# Patient Record
Sex: Female | Born: 1990 | Race: Black or African American | Hispanic: No | Marital: Single | Smoking: Never smoker
Health system: Southern US, Community
[De-identification: ages and names within clinical notes are randomized; demographics above are authoritative.]

## PROBLEM LIST (undated history)

## (undated) HISTORY — PX: SALPINGECTOMY: SHX328

## (undated) HISTORY — PX: OOPHORECTOMY: SHX86

---

## 2020-11-26 ENCOUNTER — Encounter (HOSPITAL_COMMUNITY): Payer: Self-pay

## 2020-11-26 ENCOUNTER — Other Ambulatory Visit: Payer: Self-pay

## 2020-11-26 ENCOUNTER — Emergency Department (HOSPITAL_COMMUNITY)
Admission: EM | Admit: 2020-11-26 | Discharge: 2020-11-27 | Disposition: A | Discharge status: Home / Self Care | Payer: Medicaid Other | Attending: Emergency Medicine | Admitting: Emergency Medicine

## 2020-11-26 DIAGNOSIS — N760 Acute vaginitis: Secondary | ICD-10-CM | POA: Diagnosis not present

## 2020-11-26 DIAGNOSIS — B9689 Other specified bacterial agents as the cause of diseases classified elsewhere: Secondary | ICD-10-CM

## 2020-11-26 DIAGNOSIS — N939 Abnormal uterine and vaginal bleeding, unspecified: Secondary | ICD-10-CM

## 2020-11-26 LAB — PREGNANCY, URINE: Preg Test, Ur: NEGATIVE

## 2020-11-26 NOTE — ED Triage Notes (Signed)
Pt states she has taken multiple pregnancy tests, 3 were positive and 1 was negative. Pt stated she was spotting yesterday, but bleeding more today. Pt states there is just spotting in her pad, but bleeding in the toilet when she urinates. Pt denies pain.

## 2020-11-26 NOTE — ED Provider Notes (Signed)
Emergency Medicine Provider Triage Evaluation Note  Hannah Wade , a 30 y.o. female  was evaluated in triage.  Pt complains of vag bleeding after positive preg tests. One + and one - today.  Review of Systems  Positive: Vaginal bleeding Negative: Abd pain, cramping  Physical Exam  BP (!) 119/51 (BP Location: Left Arm)   Pulse 89   Temp 98.3 F (36.8 C) (Oral)   Resp 16   Ht 5\' 9"  (1.753 m)   Wt 61.7 kg   SpO2 100%   BMI 20.10 kg/m  Gen:   Awake, no distress   Resp:  Normal effort  MSK:   Moves extremities without difficulty  Other:    Medical Decision Making  Medically screening exam initiated at 7:52 PM.  Appropriate orders placed.  Hannah Wade was informed that the remainder of the evaluation will be completed by another provider, this initial triage assessment does not replace that evaluation, and the importance of remaining in the ED until their evaluation is complete.     Thurman Coyer 11/26/20 11/28/20, MD 11/26/20 [phone]

## 2020-11-26 NOTE — ED Provider Notes (Signed)
[facility]-EMERGENCY DEPT Provider Note   CSN: [id number] Arrival date & time: 11/26/20  1910     History Chief Complaint  Patient presents with   Vaginal Bleeding    Hannah Wade is a 30 y.o. female with history of ectopic pregnancy s/p nephrectomy who presents emergency department with a chief complaint of vaginal bleeding.  The patient reports that she developed very light spotting yesterday and vaginal bleeding today.  She has only noticed blood with micturition and has not noticed any blood on her pad.  She reports that she took a Plan B pill on August 18.  She tracks her cycles and that was the day of ovulation.  She reports that she googled her symptoms and found that Plan B can be less effective during ovulation.  Thus, she took several home pregnancy test.  She reports that the first 2 were positive, she then took 2 additional pregnancy test today and one was positive and 1 was negative.  Given her history of ectopic pregnancy, she was concerned that she may be having another ectopic pregnancy, which prompted her to come to the ED.  She reports minimal lower, nonradiating abdominal cramping that began tonight.  No fever, chills, vaginal discharge, dysuria, back pain, flank pain, chest pain, shortness of breath, diarrhea, constipation, nausea, or vomiting.  No known aggravating or alleviating factors.  Reports that she has regular menstrual cycles.  Before this episode of bleeding, LMP was 8/4 and lasted for 6 days.  She reports that she last had intercourse 1 week ago.   The history is provided by the patient and medical records. No language interpreter was used.      History reviewed. No pertinent past medical history.  There are no problems to display for this patient.   History reviewed. No pertinent surgical history.   OB History   No obstetric history on file.     History reviewed. No pertinent family history.     Home Medications Prior to  Admission medications   Medication Sig Start Date End Date Taking? Authorizing Provider  metroNIDAZOLE (FLAGYL) 500 MG tablet Take 1 tablet (500 mg total) by mouth 2 (two) times daily for 7 days. 11/27/20 12/04/20 Yes Fahd Galea A, PA-C    Allergies    Patient has no known allergies.  Review of Systems   Review of Systems  Constitutional:  Negative for activity change, chills and fever.  Respiratory:  Negative for shortness of breath.   Cardiovascular:  Negative for chest pain.  Gastrointestinal:  Positive for abdominal pain. Negative for diarrhea, nausea, rectal pain and vomiting.  Genitourinary:  Positive for menstrual problem and vaginal bleeding. Negative for difficulty urinating, dysuria, flank pain, frequency, hematuria, urgency, vaginal discharge and vaginal pain.  Musculoskeletal:  Negative for back pain, myalgias, neck pain and neck stiffness.  Skin:  Negative for color change, rash and wound.  Allergic/Immunologic: Negative for immunocompromised state.  Neurological:  Negative for dizziness, seizures, syncope, weakness, numbness and headaches.  Psychiatric/Behavioral:  Negative for confusion.    Physical Exam Updated Vital Signs BP 118/80 (BP Location: Left Arm)   Pulse 70   Temp 98.4 F (36.9 C) (Oral)   Resp 16   Ht 5\' 9"  (1.753 m)   Wt 61.7 kg   SpO2 100%   BMI 20.10 kg/m   Physical Exam Vitals and nursing note reviewed. Exam conducted with a chaperone present.  Constitutional:      General: She is not in acute distress.  Appearance: She is not ill-appearing, toxic-appearing or diaphoretic.  HENT:     Head: Normocephalic.  Eyes:     Conjunctiva/sclera: Conjunctivae normal.  Cardiovascular:     Rate and Rhythm: Normal rate and regular rhythm.     Heart sounds: No murmur heard.   No friction rub. No gallop.  Pulmonary:     Effort: Pulmonary effort is normal. No respiratory distress.     Breath sounds: No stridor. No wheezing, rhonchi or rales.  Chest:      Chest wall: No tenderness.  Abdominal:     General: There is no distension.     Palpations: Abdomen is soft. There is no mass.     Tenderness: There is no abdominal tenderness. There is no right CVA tenderness, left CVA tenderness, guarding or rebound.     Hernia: No hernia is present.     Comments: Abdomen soft, nontender, nondistended.  Normoactive bowel sounds.  No tenderness over McBurney's point.  No CVA tenderness bilaterally.  Negative Murphy sign.  Genitourinary:    Comments: Dark red blood noted in the vaginal vault.  No clots.  No products of conception.  Cervix is closed.  No cervical motion tenderness.  No adnexal tenderness on the right.  There was felt to be some scarring, likely from previous oophorectomy on the left.  No adnexal masses. Musculoskeletal:     Cervical back: Neck supple.  Skin:    General: Skin is warm.     Findings: No rash.  Neurological:     Mental Status: She is alert.  Psychiatric:        Behavior: Behavior normal.    ED Results / Procedures / Treatments   Labs (all labs ordered are listed, but only abnormal results are displayed) Labs Reviewed  WET PREP, GENITAL - Abnormal; Notable for the following components:      Result Value   Clue Cells Wet Prep HPF POC PRESENT (*)    All other components within normal limits  PREGNANCY, URINE  GC/CHLAMYDIA PROBE AMP () NOT AT [facility]    EKG None  Radiology No results found.  Procedures Procedures   Medications Ordered in ED Medications - No data to display  ED Course  I have reviewed the triage vital signs and the nursing notes.  Pertinent labs & imaging results that were available during my care of the patient were reviewed by me and considered in my medical decision making (see chart for details).    MDM Rules/Calculators/A&P                           30 year old female with history of ectopic pregnancy s/p nephrectomy who presents emergency department with vaginal bleeding,  onset yesterday.  Patient is concerned that she may be having an ectopic pregnancy as she had multiple at home pregnancy test that were positive last week.  Today, she had 1 test that was positive and 1 that was negative.  She took Plan B on August 18, but was concerned that it was ineffective as it was the day that she was ovulating.  She reports minimal associated abdominal pain.  No constitutional symptoms.  Vital signs are unremarkable.  Abdomen is benign.  Pelvic exam with vaginal bleeding, but no products of conception.  Cervix is closed.  No adnexal tenderness or masses.  Labs have been reviewed and independently interpreted by me.  Pregnancy test is negative.  Wet prep with clue cells.  Will treat with Flagyl for bacterial vaginitis.  GC and Chlamydia are pending, but low suspicion for positive test given patient history.  I suspect that this is most likely patient's normal monthly menstrual cycle given that it is 28 days from onset of her last menstrual cycle.  Doubt ectopic pregnancy, spontaneous abortion, PID, appendicitis, pyelonephritis, UTI, bowel obstruction, cholecystitis, pancreatitis.  Vaginal bleeding has been minimal, and checking other labs, including a CBC would not be helpful at this time.  Patient is well-appearing and in no acute distress.  We will provide the patient with a referral to OB/GYN.  ER return precautions given.  Safer discharge to home with outpatient follow-up as discussed.   Final Clinical Impression(s) / ED Diagnoses Final diagnoses:  Vaginal bleeding  Bacterial vaginitis    Rx / DC Orders ED Discharge Orders          Ordered    metroNIDAZOLE (FLAGYL) 500 MG tablet  2 times daily        11/27/20 0037             Emelly Wurtz A, PA-C 11/27/20 0049    Zadie Rhine, MD 11/27/20 [phone]

## 2020-11-27 LAB — GC/CHLAMYDIA PROBE AMP (~~LOC~~) NOT AT ARMC
Chlamydia: NEGATIVE
Comment: NEGATIVE
Comment: NORMAL
Neisseria Gonorrhea: NEGATIVE

## 2020-11-27 LAB — WET PREP, GENITAL
Sperm: NONE SEEN
Trich, Wet Prep: NONE SEEN
WBC, Wet Prep HPF POC: NONE SEEN
Yeast Wet Prep HPF POC: NONE SEEN

## 2020-11-27 MED ORDER — METRONIDAZOLE 500 MG PO TABS: 500.0000 mg | ORAL_TABLET | Freq: Two times a day (BID) | ORAL | 0 refills | Status: AC

## 2020-11-27 NOTE — Discharge Instructions (Addendum)
Thank you for allowing me to care for you today in the Emergency Department.   Your pregnancy test in the emergency department was negative.  You did have clue cells on your pelvic exam.  You do have a pending gonorrhea and chlamydia test.  You would receive a call from the hospital if these test were positive or you can check your MyChart account.  If you have not created a MyChart account, you can follow the instructions on your discharge paperwork to create 1.  Take 1 tablet of Flagyl 2 times daily for the next 7 days for bacterial vaginitis.  It is very important you do not drink alcohol while you are taking this antibiotic as it can cause severe vomiting.  Most likely bleeding is your monthly menstrual cycle since you typically have regular periods. Today is 28 days since your last menstrual cycle.   Return the emergency department if you develop severe, uncontrollable abdominal pain, persistent vomiting, severe abdominal pain with a fever, temperature of 100.4 F or higher, severe vaginal bleeding accompanied by shortness of breath, if you pass out, or other new, concerning symptoms

## 2020-11-28 ENCOUNTER — Telehealth: Payer: Self-pay | Admitting: *Deleted

## 2020-11-28 NOTE — Telephone Encounter (Signed)
Transition Care Management Follow-up Telephone Call Date of discharge and from where: 11/27/2020 - [facility] How have you been since you were released from the hospital? "Okay" Any questions or concerns? No  Items Reviewed: Did the pt receive and understand the discharge instructions provided? Yes  Medications obtained and verified?  N/A Other? No  Any new allergies since your discharge? No  Dietary orders reviewed? No Do you have support at home? Yes    Functional Questionnaire: (I = Independent and D = Dependent) ADLs: I  Bathing/Dressing- I  Meal Prep- I  Eating- I  Maintaining continence- I  Transferring/Ambulation- I  Managing Meds- I  Follow up appointments reviewed:  PCP Hospital f/u appt confirmed? No   Specialist Hospital f/u appt confirmed? No   Are transportation arrangements needed? No  If their condition worsens, is the pt aware to call PCP or go to the Emergency Dept.? Yes Was the patient provided with contact information for the PCP's office or ED? Yes Was to pt encouraged to call back with questions or concerns? Yes

## 2021-01-28 ENCOUNTER — Emergency Department
Admission: EM | Admit: 2021-01-28 | Discharge: 2021-01-28 | Disposition: A | Discharge status: Home / Self Care | Payer: Medicaid Other | Attending: Emergency Medicine | Admitting: Emergency Medicine

## 2021-01-28 ENCOUNTER — Emergency Department: Payer: Medicaid Other

## 2021-01-28 ENCOUNTER — Encounter: Payer: Self-pay | Admitting: Emergency Medicine

## 2021-01-28 ENCOUNTER — Other Ambulatory Visit: Payer: Self-pay

## 2021-01-28 DIAGNOSIS — R109 Unspecified abdominal pain: Secondary | ICD-10-CM | POA: Diagnosis not present

## 2021-01-28 DIAGNOSIS — O2310 Infections of bladder in pregnancy, unspecified trimester: Secondary | ICD-10-CM | POA: Diagnosis not present

## 2021-01-28 DIAGNOSIS — O26891 Other specified pregnancy related conditions, first trimester: Secondary | ICD-10-CM | POA: Diagnosis not present

## 2021-01-28 DIAGNOSIS — N3000 Acute cystitis without hematuria: Secondary | ICD-10-CM | POA: Diagnosis not present

## 2021-01-28 DIAGNOSIS — O2311 Infections of bladder in pregnancy, first trimester: Secondary | ICD-10-CM | POA: Diagnosis not present

## 2021-01-28 DIAGNOSIS — O99891 Other specified diseases and conditions complicating pregnancy: Secondary | ICD-10-CM | POA: Insufficient documentation

## 2021-01-28 DIAGNOSIS — R1031 Right lower quadrant pain: Secondary | ICD-10-CM

## 2021-01-28 DIAGNOSIS — M545 Low back pain, unspecified: Secondary | ICD-10-CM | POA: Diagnosis not present

## 2021-01-28 DIAGNOSIS — Z3A01 Less than 8 weeks gestation of pregnancy: Secondary | ICD-10-CM | POA: Diagnosis not present

## 2021-01-28 LAB — CBC
HCT: 36.7 % (ref 36.0–46.0)
Hemoglobin: 11.6 g/dL — ABNORMAL LOW (ref 12.0–15.0)
MCH: 24.1 pg — ABNORMAL LOW (ref 26.0–34.0)
MCHC: 31.6 g/dL (ref 30.0–36.0)
MCV: 76.3 fL — ABNORMAL LOW (ref 80.0–100.0)
Platelets: 227 10*3/uL (ref 150–400)
RBC: 4.81 MIL/uL (ref 3.87–5.11)
RDW: 17 % — ABNORMAL HIGH (ref 11.5–15.5)
WBC: 6.8 10*3/uL (ref 4.0–10.5)
nRBC: 0 % (ref 0.0–0.2)

## 2021-01-28 LAB — BASIC METABOLIC PANEL
Anion gap: 7 (ref 5–15)
BUN: 17 mg/dL (ref 6–20)
CO2: 26 mmol/L (ref 22–32)
Calcium: 9.4 mg/dL (ref 8.9–10.3)
Chloride: 103 mmol/L (ref 98–111)
Creatinine, Ser: 0.74 mg/dL (ref 0.44–1.00)
GFR, Estimated: >60 mL/min (ref >60)
Glucose, Bld: 103 mg/dL — ABNORMAL HIGH (ref 70–99)
Potassium: 3.4 mmol/L — ABNORMAL LOW (ref 3.5–5.1)
Sodium: 136 mmol/L (ref 135–145)

## 2021-01-28 LAB — POC URINE PREG, ED: Preg Test, Ur: POSITIVE — AB

## 2021-01-28 LAB — URINALYSIS, COMPLETE (UACMP) WITH MICROSCOPIC
Bacteria, UA: NONE SEEN
Bilirubin Urine: NEGATIVE
Glucose, UA: NEGATIVE mg/dL
Hgb urine dipstick: NEGATIVE
Ketones, ur: 5 mg/dL — AB
Nitrite: NEGATIVE
Protein, ur: 30 mg/dL — AB
Specific Gravity, Urine: 1.031 — ABNORMAL HIGH (ref 1.005–1.030)
pH: 6 (ref 5.0–8.0)

## 2021-01-28 LAB — HCG, QUANTITATIVE, PREGNANCY: hCG, Beta Chain, Quant, S: 1249 m[IU]/mL — ABNORMAL HIGH (ref <5)

## 2021-01-28 MED ORDER — CEPHALEXIN 500 MG PO CAPS
500.0000 mg | ORAL_CAPSULE | Freq: Once | ORAL | Status: AC
  Administered 2021-01-28: 500 mg via ORAL
  Filled 2021-01-28: qty 1

## 2021-01-28 MED ORDER — CEPHALEXIN 500 MG PO CAPS: 500.0000 mg | ORAL_CAPSULE | Freq: Four times a day (QID) | ORAL | 0 refills | Status: AC

## 2021-01-28 NOTE — ED Triage Notes (Signed)
Pt to ED from home c/o lower mid back pain wrapping around left and right side to vaginal area like shooting pain and cramping.  Denies urinary changes, vaginal bleeding/discharge/or odor.  Pt states nausea a couple days ago but not today.

## 2021-01-28 NOTE — ED Provider Notes (Signed)
[facility] EMERGENCY DEPARTMENT Provider Note   CSN: [id number] Arrival date & time: 01/28/21  1919     History Chief Complaint  Patient presents with  . Back Pain    Hannah Wade is a 30 y.o. female presents for evaluation of low back pain.  Patient describes tightness in the lower back radiating to the left and right side symptoms began today.  She denies any fevers, urinary symptoms, vaginal bleeding or discharge.  Last menstrual period December 25, 2020.  She has a history of ectopic pregnancy with oophorectomy.  Patient noted to have positive urine pregnancy test.  She denies any numbness tingling radicular symptoms.  No trauma or injury.  Patient did have some nausea couple days ago but no nausea today.  HPI     History reviewed. No pertinent past medical history.  There are no problems to display for this patient.   Past Surgical History:  Procedure Laterality Date  . OOPHORECTOMY     Partial, unsure which side     OB History     Gravida  1   Para      Term      Preterm      AB      Living         SAB      IAB      Ectopic      Multiple      Live Births              History reviewed. No pertinent family history.  Social History   Tobacco Use  . Smoking status: Never  . Smokeless tobacco: Never  Substance Use Topics  . Alcohol use: Never    Comment: socially  . Drug use: Never    Home Medications Prior to Admission medications   Not on File    Allergies    Patient has no known allergies.  Review of Systems   Review of Systems  Constitutional:  Negative for activity change, chills, fatigue and fever.  HENT:  Negative for congestion, sinus pressure and sore throat.   Eyes:  Negative for visual disturbance.  Respiratory:  Negative for cough, chest tightness and shortness of breath.   Cardiovascular:  Negative for chest pain and leg swelling.  Gastrointestinal:  Negative for abdominal pain, diarrhea,  nausea and vomiting.  Genitourinary:  Negative for dysuria, pelvic pain and vaginal discharge.  Musculoskeletal:  Positive for back pain. Negative for arthralgias and gait problem.  Skin:  Negative for rash.  Neurological:  Negative for weakness, numbness and headaches.  Hematological:  Negative for adenopathy.  Psychiatric/Behavioral:  Negative for agitation, behavioral problems and confusion.    Physical Exam Updated Vital Signs BP 120/87 (BP Location: Left Arm)   Pulse 87   Temp 98.2 F (36.8 C) (Oral)   Resp 17   Ht 5\' 9"  (1.753 m)   Wt 58.1 kg   LMP 12/25/2020 (Approximate)   SpO2 100%   BMI 18.90 kg/m   Physical Exam Constitutional:      Appearance: She is well-developed.  HENT:     Head: Normocephalic and atraumatic.  Eyes:     Conjunctiva/sclera: Conjunctivae normal.  Cardiovascular:     Rate and Rhythm: Normal rate.  Pulmonary:     Effort: Pulmonary effort is normal. No respiratory distress.  Abdominal:     General: There is no distension.     Tenderness: There is no abdominal tenderness. There is no guarding.  Musculoskeletal:  General: Normal range of motion.     Cervical back: Normal range of motion.     Comments: Mild lower lumbar paravertebral muscle tenderness with no spinous process tenderness.  Skin:    General: Skin is warm.     Findings: No rash.  Neurological:     General: No focal deficit present.     Mental Status: She is alert and oriented to person, place, and time.     Cranial Nerves: No cranial nerve deficit.  Psychiatric:        Behavior: Behavior normal.        Thought Content: Thought content normal.    ED Results / Procedures / Treatments   Labs (all labs ordered are listed, but only abnormal results are displayed) Labs Reviewed  URINALYSIS, COMPLETE (UACMP) WITH MICROSCOPIC - Abnormal; Notable for the following components:      Result Value   Color, Urine AMBER (*)    APPearance CLOUDY (*)    Specific Gravity, Urine  1.031 (*)    Ketones, ur 5 (*)    Protein, ur 30 (*)    Leukocytes,Ua TRACE (*)    All other components within normal limits  HCG, QUANTITATIVE, PREGNANCY - Abnormal; Notable for the following components:   hCG, Beta Chain, Quant, S 1,249 (*)    All other components within normal limits  CBC - Abnormal; Notable for the following components:   Hemoglobin 11.6 (*)    MCV 76.3 (*)    MCH 24.1 (*)    RDW 17.0 (*)    All other components within normal limits  BASIC METABOLIC PANEL - Abnormal; Notable for the following components:   Potassium 3.4 (*)    Glucose, Bld 103 (*)    All other components within normal limits  POC URINE PREG, ED - Abnormal; Notable for the following components:   Preg Test, Ur POSITIVE (*)    All other components within normal limits    EKG None  Radiology US OB LESS THAN 14 WEEKS WITH OB TRANSVAGINAL  Result Date: 01/28/2021 CLINICAL DATA:  Low back pain with cramping EXAM: OBSTETRIC <14 WK [place] AND TRANSVAGINAL OB US TECHNIQUE: Both transabdominal and transvaginal ultrasound examinations were performed for complete evaluation of the gestation as well as the maternal uterus, adnexal regions, and pelvic cul-de-sac. Transvaginal technique was performed to assess early pregnancy. COMPARISON:  None. FINDINGS: Intrauterine gestational sac: Single Yolk sac:  Not Visualized. Embryo:  Not Visualized. Cardiac Activity: Not Visualized. Heart Rate:   bpm MSD: 2.5 mm   5 w   0 d CRL:    mm    w    d                  [place] EDC: Subchorionic hemorrhage:  None visualized. Maternal uterus/adnexae: No adnexal mass. Small amount of free fluid in the pelvis. IMPRESSION: Probable early intrauterine gestational sac, but no yolk sac, fetal pole, or cardiac activity yet visualized. Recommend follow-up quantitative B-HCG levels and follow-up US in 14 days to assess viability. This recommendation follows SRU consensus guidelines: Diagnostic Criteria for Nonviable Pregnancy Early in the First  Trimester. [place] Med [id number]. Electronically Signed   By: Rolm Baptise M.D.   On: 01/28/2021 23:31    Procedures Procedures   Medications Ordered in ED Medications  cephALEXin (KEFLEX) capsule 500 mg (has no administration in time range)    ED Course  I have reviewed the triage vital signs and the nursing notes.  Pertinent labs & imaging results that were available during my care of the patient were reviewed by me and considered in my medical decision making (see chart for details).    MDM Rules/Calculators/A&P                         30 year old female with lower back pain radiating into the pelvic area.  Pain mild.  Vital signs stable, afebrile with no nausea or vomiting.  No tenderness along the abdominal or pelvic area on exam.  Patient noted to have positive urine pregnancy test.  She has a history of ectopic pregnancy.  Ultrasound obtained showing probable early intrauterine gestational sac.  Too early to visualize fetal pole/cardiac activity.  Recommendation was for follow-up quantitative hCG and ultrasound in 14 days.  Patient will follow-up with OB.  She understands signs and symptoms return to the ER for.  She was noted to have signs of UTI.  Placed on cephalexin. Final Clinical Impression(s) / ED Diagnoses Final diagnoses:  Lower back pain  Bilateral lower abdominal cramping    Rx / DC Orders ED Discharge Orders     None        Renata Caprice 01/29/21 Laurell Roof, MD 01/29/21 0147

## 2021-01-28 NOTE — Discharge Instructions (Addendum)
Please take antibiotics as prescribed.  Make sure you are drinking lots of fluids.  You may take Tylenol only for pain.  Please start taking a daily prenatal vitamin.  Call obstetrician's office tomorrow morning to schedule follow-up appointment in 2 weeks for recheck return to the ER for any increasing pain worsening symptoms or urgent changes in her health

## 2021-02-10 ENCOUNTER — Other Ambulatory Visit: Payer: Self-pay

## 2021-02-10 DIAGNOSIS — N9489 Other specified conditions associated with female genital organs and menstrual cycle: Secondary | ICD-10-CM | POA: Insufficient documentation

## 2021-02-10 DIAGNOSIS — O26891 Other specified pregnancy related conditions, first trimester: Secondary | ICD-10-CM | POA: Diagnosis not present

## 2021-02-10 DIAGNOSIS — O209 Hemorrhage in early pregnancy, unspecified: Secondary | ICD-10-CM | POA: Diagnosis not present

## 2021-02-10 DIAGNOSIS — Z3A01 Less than 8 weeks gestation of pregnancy: Secondary | ICD-10-CM | POA: Insufficient documentation

## 2021-02-10 DIAGNOSIS — R102 Pelvic and perineal pain: Secondary | ICD-10-CM | POA: Diagnosis not present

## 2021-02-10 DIAGNOSIS — O99011 Anemia complicating pregnancy, first trimester: Secondary | ICD-10-CM | POA: Diagnosis not present

## 2021-02-10 DIAGNOSIS — N8311 Corpus luteum cyst of right ovary: Secondary | ICD-10-CM | POA: Diagnosis not present

## 2021-02-10 LAB — CBC WITH DIFFERENTIAL/PLATELET
Abs Immature Granulocytes: 0.01 10*3/uL (ref 0.00–0.07)
Basophils Absolute: 0.1 10*3/uL (ref 0.0–0.1)
Basophils Relative: 1 %
Eosinophils Absolute: 0.4 10*3/uL (ref 0.0–0.5)
Eosinophils Relative: 5 %
HCT: 29 % — ABNORMAL LOW (ref 36.0–46.0)
Hemoglobin: 9.2 g/dL — ABNORMAL LOW (ref 12.0–15.0)
Immature Granulocytes: 0 %
Lymphocytes Relative: 36 %
Lymphs Abs: 3.1 10*3/uL (ref 0.7–4.0)
MCH: 24.7 pg — ABNORMAL LOW (ref 26.0–34.0)
MCHC: 31.7 g/dL (ref 30.0–36.0)
MCV: 78 fL — ABNORMAL LOW (ref 80.0–100.0)
Monocytes Absolute: 0.5 10*3/uL (ref 0.1–1.0)
Monocytes Relative: 6 %
Neutro Abs: 4.6 10*3/uL (ref 1.7–7.7)
Neutrophils Relative %: 52 %
Platelets: 248 10*3/uL (ref 150–400)
RBC: 3.72 MIL/uL — ABNORMAL LOW (ref 3.87–5.11)
RDW: 18.1 % — ABNORMAL HIGH (ref 11.5–15.5)
WBC: 8.7 10*3/uL (ref 4.0–10.5)
nRBC: 0 % (ref 0.0–0.2)

## 2021-02-10 LAB — COMPREHENSIVE METABOLIC PANEL
ALT: 15 U/L (ref 0–44)
AST: 16 U/L (ref 15–41)
Albumin: 4.2 g/dL (ref 3.5–5.0)
Alkaline Phosphatase: 50 U/L (ref 38–126)
Anion gap: 5 (ref 5–15)
BUN: 14 mg/dL (ref 6–20)
CO2: 25 mmol/L (ref 22–32)
Calcium: 8.9 mg/dL (ref 8.9–10.3)
Chloride: 105 mmol/L (ref 98–111)
Creatinine, Ser: 0.61 mg/dL (ref 0.44–1.00)
GFR, Estimated: >60 mL/min (ref >60)
Glucose, Bld: 96 mg/dL (ref 70–99)
Potassium: 3.7 mmol/L (ref 3.5–5.1)
Sodium: 135 mmol/L (ref 135–145)
Total Bilirubin: 0.4 mg/dL (ref 0.3–1.2)
Total Protein: 7.1 g/dL (ref 6.5–8.1)

## 2021-02-10 LAB — ABO/RH: ABO/RH(D): O POS

## 2021-02-10 LAB — HCG, QUANTITATIVE, PREGNANCY: hCG, Beta Chain, Quant, S: 3329 m[IU]/mL — ABNORMAL HIGH (ref <5)

## 2021-02-10 NOTE — ED Triage Notes (Signed)
Pt states that she has had some abd cramping on the left side that started earlier today, pt is pregnant, has not been seen pt obgyn yet, last period 12/25/20. Pt states that she has had some pink discharge as well.

## 2021-02-11 ENCOUNTER — Emergency Department: Payer: Medicaid Other

## 2021-02-11 ENCOUNTER — Emergency Department
Admission: EM | Admit: 2021-02-11 | Discharge: 2021-02-11 | Disposition: A | Discharge status: Home / Self Care | Payer: Medicaid Other | Attending: Emergency Medicine | Admitting: Emergency Medicine

## 2021-02-11 DIAGNOSIS — O26891 Other specified pregnancy related conditions, first trimester: Secondary | ICD-10-CM | POA: Diagnosis not present

## 2021-02-11 DIAGNOSIS — O4690 Antepartum hemorrhage, unspecified, unspecified trimester: Secondary | ICD-10-CM

## 2021-02-11 DIAGNOSIS — Z3A01 Less than 8 weeks gestation of pregnancy: Secondary | ICD-10-CM | POA: Diagnosis not present

## 2021-02-11 DIAGNOSIS — N8311 Corpus luteum cyst of right ovary: Secondary | ICD-10-CM | POA: Diagnosis not present

## 2021-02-11 DIAGNOSIS — D6489 Other specified anemias: Secondary | ICD-10-CM

## 2021-02-11 DIAGNOSIS — R102 Pelvic and perineal pain: Secondary | ICD-10-CM

## 2021-02-11 LAB — URINALYSIS, ROUTINE W REFLEX MICROSCOPIC
Bilirubin Urine: NEGATIVE
Glucose, UA: NEGATIVE mg/dL
Hgb urine dipstick: NEGATIVE
Ketones, ur: NEGATIVE mg/dL
Leukocytes,Ua: NEGATIVE
Nitrite: NEGATIVE
Protein, ur: NEGATIVE mg/dL
Specific Gravity, Urine: 1.027 (ref 1.005–1.030)
pH: 5 (ref 5.0–8.0)

## 2021-02-11 MED ORDER — FERROUS SULFATE 325 (65 FE) MG PO TABS: 325.0000 mg | ORAL_TABLET | Freq: Every day | ORAL | 3 refills | Status: AC

## 2021-02-11 NOTE — ED Notes (Signed)
Pt in US

## 2021-02-11 NOTE — ED Provider Notes (Addendum)
[facility] Emergency Department Provider Note  ____________________________________________   Event Date/Time   First MD Initiated Contact with Patient 02/11/21 [phone]     (approximate)  I have reviewed the triage vital signs and the nursing notes.   HISTORY  Chief Complaint Abdominal Pain and Vaginal Bleeding    HPI Hannah Wade is a 30 y.o. female [id number] who presents to the emergency department with complaints of minimal cramping in the left lower quadrant and seeing a small amount of blood on the toilet paper when she wiped today.  Patient is currently pregnant and her last menstrual period was September 29.  She has had a previous ectopic pregnancy and has required surgery for this but cannot remember what side her ectopic was located.  She has an appoint with her OB/GYN in [place] on Tuesday the 22nd which is her first appointment.  She denies any fevers, nausea or vomiting, dysuria or hematuria, vaginal discharge.  States her bleeding is currently resolved.  She was seen here in the emergency department on 01/28/2021 and at that time ultrasound showed a probable early intrauterine gestational sac but no yolk sac, fetal pole or cardiac activity visualized.  Her hCG at that time was 1249.        History reviewed. No pertinent past medical history.  There are no problems to display for this patient.   Past Surgical History:  Procedure Laterality Date   OOPHORECTOMY     Partial, unsure which side    Prior to Admission medications   Medication Sig Start Date End Date Taking? Authorizing Provider  ferrous sulfate 325 (65 FE) MG tablet Take 1 tablet (325 mg total) by mouth daily. 02/11/21 02/11/22 Yes Karaline Buresh, Layla MawKristen N, DO    Allergies Patient has no known allergies.  No family history on file.  Social History Social History   Tobacco Use   Smoking status: Never   Smokeless tobacco: Never  Substance Use Topics   Alcohol use: Never     Comment: socially   Drug use: Never    Review of Systems Constitutional: No fever. Eyes: No visual changes. ENT: No sore throat. Cardiovascular: Denies chest pain. Respiratory: Denies shortness of breath. Gastrointestinal: No nausea, vomiting, diarrhea. Genitourinary: Negative for dysuria. Musculoskeletal: Negative for back pain. Skin: Negative for rash. Neurological: Negative for focal weakness or numbness.  ____________________________________________   PHYSICAL EXAM:  VITAL SIGNS: ED Triage Vitals  Enc Vitals Group     BP 02/10/21 2231 117/75     Pulse Rate 02/10/21 2231 86     Resp 02/10/21 2231 16     Temp 02/10/21 2231 98.5 F (36.9 C)     Temp Source 02/10/21 2231 Oral     SpO2 02/10/21 2231 96 %     Weight 02/10/21 2231 129 lb (58.5 kg)     Height 02/10/21 2231 5\' 9"  (1.753 m)     Head Circumference --      Peak Flow --      Pain Score 02/10/21 2237 5     Pain Loc --      Pain Edu? --      Excl. in GC? --    CONSTITUTIONAL: Alert and oriented and responds appropriately to questions. Well-appearing; well-nourished HEAD: Normocephalic EYES: Conjunctivae clear, pupils appear equal, EOM appear intact ENT: normal nose; moist mucous membranes NECK: Supple, normal ROM CARD: RRR; S1 and S2 appreciated; no murmurs, no clicks, no rubs, no gallops RESP: Normal chest excursion without splinting or  tachypnea; breath sounds clear and equal bilaterally; no wheezes, no rhonchi, no rales, no hypoxia or respiratory distress, speaking full sentences ABD/GI: Normal bowel sounds; non-distended; soft, non-tender, no rebound, no guarding, no peritoneal signs, no hepatosplenomegaly BACK: The back appears normal EXT: Normal ROM in all joints; no deformity noted, no edema; no cyanosis SKIN: Normal color for age and race; warm; no rash on exposed skin NEURO: Moves all extremities equally PSYCH: The patient's mood and manner are  appropriate.  ____________________________________________   LABS (all labs ordered are listed, but only abnormal results are displayed)  Labs Reviewed  HCG, QUANTITATIVE, PREGNANCY - Abnormal; Notable for the following components:      Result Value   hCG, Beta Chain, Quant, S 3,329 (*)    All other components within normal limits  CBC WITH DIFFERENTIAL/PLATELET - Abnormal; Notable for the following components:   RBC 3.72 (*)    Hemoglobin 9.2 (*)    HCT 29.0 (*)    MCV 78.0 (*)    MCH 24.7 (*)    RDW 18.1 (*)    All other components within normal limits  URINALYSIS, ROUTINE W REFLEX MICROSCOPIC - Abnormal; Notable for the following components:   Color, Urine YELLOW (*)    APPearance HAZY (*)    All other components within normal limits  COMPREHENSIVE METABOLIC PANEL  ABO/RH   ____________________________________________  EKG   ____________________________________________  RADIOLOGY I, Kasaundra Fahrney, personally viewed and evaluated these images (plain radiographs) as part of my medical decision making, as well as reviewing the written report by the radiologist.  ED MD interpretation: Ultrasound shows single intrauterine pregnancy with normal fetal cardiac activity.  Official radiology report(s): US OB LESS THAN 14 WEEKS W/ OB TRANSVAGINAL AND DOPPLER  Result Date: 02/11/2021 CLINICAL DATA:  Pelvic pain for 1 day EXAM: OBSTETRIC <14 WK [place] AND TRANSVAGINAL OB US DOPPLER ULTRASOUND OF OVARIES TECHNIQUE: Both transabdominal and transvaginal ultrasound examinations were performed for complete evaluation of the gestation as well as the maternal uterus, adnexal regions, and pelvic cul-de-sac. Transvaginal technique was performed to assess early pregnancy. Color and duplex Doppler ultrasound was utilized to evaluate blood flow to the ovaries. COMPARISON:  01/28/2021 FINDINGS: Intrauterine gestational sac: Present Yolk sac:  Present Embryo:  Present Cardiac Activity: Present Heart  Rate: 125 bpm CRL:   3.8 mm   6 w 0 d                  [place] EDC: 10/07/2021 Subchorionic hemorrhage:  None visualized. Maternal uterus/adnexae: Ovaries are well visualized bilaterally with normal vascular signals. Corpus luteum cyst is noted within the right ovary. Pulsed Doppler evaluation of both ovaries demonstrates normal appearing low-resistance arterial and venous waveforms. IMPRESSION: Single live intrauterine gestation at 6 weeks 0 days. No complicating factors are noted. Normal appearing ovaries bilaterally with normal vascularity. Electronically Signed   By: Inez Catalina M.D.   On: 02/11/2021 01:27    ____________________________________________   PROCEDURES  Procedure(s) performed (including Critical Care):  Procedures   ____________________________________________   INITIAL IMPRESSION / ASSESSMENT AND PLAN / ED COURSE  As part of my medical decision making, I reviewed the following data within the [facility] notes reviewed and incorporated, Labs reviewed , Old chart reviewed, ultrasound reviewed, and Notes from prior ED visits         Patient's labs today are reassuring.  Her beta hCG is rising and is currently 3329.  She is not actively bleeding and her abdominal  exam is benign.  Transvaginal ultrasound with Doppler was obtained from triage and shows a single intrauterine pregnancy with normal fetal cardiac activity.  Both ovaries have normal blood flow.  Low suspicion for cervicitis, PID, TOA.  Doubt appendicitis.  Offered STD screening today which she declines.  Urine shows no sign of infection.  She is O+ and does not need RhoGAM.  She has follow-up with her OB/GYN scheduled.  Discussed bleeding return precautions.  I feel she is safe to be discharged home.  Blood work shows that she is anemic and has a drop in her hemoglobin from about a couple of weeks ago.  She denies any heavy vaginal bleeding.  Denies any bleeding currently.  This may just be due  to pregnancy but we will start her on iron supplementation.  At this time, I do not feel there is any life-threatening condition present. I have reviewed, interpreted and discussed all results (EKG, imaging, lab, urine as appropriate) and exam findings with patient/family. I have reviewed nursing notes and appropriate previous records.  I feel the patient is safe to be discharged home without further emergent workup and can continue workup as an outpatient as needed. Discussed usual and customary return precautions. Patient/family verbalize understanding and are comfortable with this plan.  Outpatient follow-up has been provided as needed. All questions have been answered.  ____________________________________________   FINAL CLINICAL IMPRESSION(S) / ED DIAGNOSES  Final diagnoses:  Pelvic pain  Vaginal bleeding in pregnancy  Anemia due to other cause, not classified     ED Discharge Orders          Ordered    ferrous sulfate 325 (65 FE) MG tablet  Daily        02/11/21 0411            *Please note:  Jasslyn Finkel was evaluated in Emergency Department on 02/11/2021 for the symptoms described in the history of present illness. She was evaluated in the context of the global COVID-19 pandemic, which necessitated consideration that the patient might be at risk for infection with the SARS-CoV-2 virus that causes COVID-19. Institutional protocols and algorithms that pertain to the evaluation of patients at risk for COVID-19 are in a state of rapid change based on information released by regulatory bodies including the CDC and federal and state organizations. These policies and algorithms were followed during the patient's care in the ED.  Some ED evaluations and interventions may be delayed as a result of limited staffing during and the pandemic.*   Note:  This document was prepared using Dragon voice recognition software and may include unintentional dictation errors.    Heela Heishman, Layla Maw,  DO 02/11/21 0344    Eileene Kisling, Layla Maw, DO 02/11/21 2130

## 2021-02-11 NOTE — ED Notes (Signed)
POC urine POSTIVE

## 2021-02-16 ENCOUNTER — Telehealth: Payer: Self-pay | Admitting: Radiology

## 2021-02-16 NOTE — Telephone Encounter (Signed)
Left voicemail explaining that viability scan has been cancelled because she has already had a scan and to call [facility] to schedule New OB appt.

## 2021-02-20 ENCOUNTER — Emergency Department: Payer: Medicaid Other

## 2021-02-20 ENCOUNTER — Other Ambulatory Visit: Payer: Self-pay

## 2021-02-20 DIAGNOSIS — O209 Hemorrhage in early pregnancy, unspecified: Secondary | ICD-10-CM | POA: Diagnosis not present

## 2021-02-20 DIAGNOSIS — O200 Threatened abortion: Secondary | ICD-10-CM | POA: Diagnosis not present

## 2021-02-20 DIAGNOSIS — Z3A01 Less than 8 weeks gestation of pregnancy: Secondary | ICD-10-CM | POA: Diagnosis not present

## 2021-02-20 DIAGNOSIS — N939 Abnormal uterine and vaginal bleeding, unspecified: Secondary | ICD-10-CM | POA: Diagnosis not present

## 2021-02-20 LAB — CBC WITH DIFFERENTIAL/PLATELET
Abs Immature Granulocytes: 0.01 10*3/uL (ref 0.00–0.07)
Basophils Absolute: 0.1 10*3/uL (ref 0.0–0.1)
Basophils Relative: 1 %
Eosinophils Absolute: 0.3 10*3/uL (ref 0.0–0.5)
Eosinophils Relative: 4 %
HCT: 29.9 % — ABNORMAL LOW (ref 36.0–46.0)
Hemoglobin: 9.5 g/dL — ABNORMAL LOW (ref 12.0–15.0)
Immature Granulocytes: 0 %
Lymphocytes Relative: 38 %
Lymphs Abs: 2.9 10*3/uL (ref 0.7–4.0)
MCH: 24.8 pg — ABNORMAL LOW (ref 26.0–34.0)
MCHC: 31.8 g/dL (ref 30.0–36.0)
MCV: 78.1 fL — ABNORMAL LOW (ref 80.0–100.0)
Monocytes Absolute: 0.6 10*3/uL (ref 0.1–1.0)
Monocytes Relative: 8 %
Neutro Abs: 3.8 10*3/uL (ref 1.7–7.7)
Neutrophils Relative %: 49 %
Platelets: 257 10*3/uL (ref 150–400)
RBC: 3.83 MIL/uL — ABNORMAL LOW (ref 3.87–5.11)
RDW: 18.1 % — ABNORMAL HIGH (ref 11.5–15.5)
WBC: 7.6 10*3/uL (ref 4.0–10.5)
nRBC: 0 % (ref 0.0–0.2)

## 2021-02-20 LAB — URINALYSIS, ROUTINE W REFLEX MICROSCOPIC
Bilirubin Urine: NEGATIVE
Glucose, UA: NEGATIVE mg/dL
Ketones, ur: NEGATIVE mg/dL
Nitrite: NEGATIVE
Protein, ur: NEGATIVE mg/dL
RBC / HPF: >50 RBC/hpf — ABNORMAL HIGH (ref 0–5)
Specific Gravity, Urine: >1.03 — ABNORMAL HIGH (ref 1.005–1.030)
pH: 6.5 (ref 5.0–8.0)

## 2021-02-20 LAB — POC URINE PREG, ED: Preg Test, Ur: POSITIVE — AB

## 2021-02-20 LAB — COMPREHENSIVE METABOLIC PANEL
ALT: 13 U/L (ref 0–44)
AST: 14 U/L — ABNORMAL LOW (ref 15–41)
Albumin: 4.1 g/dL (ref 3.5–5.0)
Alkaline Phosphatase: 48 U/L (ref 38–126)
Anion gap: 5 (ref 5–15)
BUN: 17 mg/dL (ref 6–20)
CO2: 24 mmol/L (ref 22–32)
Calcium: 9.1 mg/dL (ref 8.9–10.3)
Chloride: 104 mmol/L (ref 98–111)
Creatinine, Ser: 0.53 mg/dL (ref 0.44–1.00)
GFR, Estimated: >60 mL/min (ref >60)
Glucose, Bld: 91 mg/dL (ref 70–99)
Potassium: 3.8 mmol/L (ref 3.5–5.1)
Sodium: 133 mmol/L — ABNORMAL LOW (ref 135–145)
Total Bilirubin: 0.7 mg/dL (ref 0.3–1.2)
Total Protein: 7.2 g/dL (ref 6.5–8.1)

## 2021-02-20 LAB — HCG, QUANTITATIVE, PREGNANCY: hCG, Beta Chain, Quant, S: 8544 m[IU]/mL — ABNORMAL HIGH (ref <5)

## 2021-02-20 LAB — ABO/RH: ABO/RH(D): O POS

## 2021-02-20 NOTE — ED Triage Notes (Signed)
Pt c/o of ABD cramping and increasing spotting from the last time she was here. tPer pt, spotting has gotten worse in the last 4-5 days. Pt is currently [redacted] weeks pregnant.

## 2021-02-21 ENCOUNTER — Emergency Department
Admission: EM | Admit: 2021-02-21 | Discharge: 2021-02-21 | Disposition: A | Discharge status: Home / Self Care | Payer: Medicaid Other | Attending: Emergency Medicine | Admitting: Emergency Medicine

## 2021-02-21 DIAGNOSIS — O209 Hemorrhage in early pregnancy, unspecified: Secondary | ICD-10-CM

## 2021-02-21 DIAGNOSIS — O200 Threatened abortion: Secondary | ICD-10-CM

## 2021-02-21 NOTE — ED Provider Notes (Signed)
[facility] Emergency Department Provider Note  ____________________________________________  Time seen: Approximately 2:51 AM  I have reviewed the triage vital signs and the nursing notes.   HISTORY  Chief Complaint Vaginal Bleeding ([redacted] weeks pregnant )   HPI Hannah Wade is a 30 y.o. female at [redacted] weeks gestational age presents for evaluation of vaginal bleeding.  Patient reports lower abdominal cramping with increasing spotting which has gotten worse over the last 4 to 5 days.  Patient has been having spotting for the last 10 days.  She denies passing large blood clots or heavy vaginal bleeding, she denies syncope or near syncope, chest pain or shortness of breath.  She has not been able to see her OB yet because her appointment was postponed  PMH Ectopic pregnancy  Past Surgical History:  Procedure Laterality Date   OOPHORECTOMY     Partial, unsure which side    Prior to Admission medications   Medication Sig Start Date End Date Taking? Authorizing Provider  ferrous sulfate 325 (65 FE) MG tablet Take 1 tablet (325 mg total) by mouth daily. 02/11/21 02/11/22  Ward, Layla Maw, DO    Allergies Patient has no known allergies.  No family history on file.  Social History Social History   Tobacco Use   Smoking status: Never   Smokeless tobacco: Never  Substance Use Topics   Alcohol use: Never    Comment: socially   Drug use: Never    Review of Systems  Constitutional: Negative for fever. Eyes: Negative for visual changes. ENT: Negative for sore throat. Neck: No neck pain  Cardiovascular: Negative for chest pain. Respiratory: Negative for shortness of breath. Gastrointestinal: Negative for abdominal pain, vomiting or diarrhea. Genitourinary: Negative for dysuria. + Suprapubic cramping and vaginal bleeding Musculoskeletal: Negative for back pain. Skin: Negative for rash. Neurological: Negative for headaches, weakness or  numbness. Psych: No SI or HI  ____________________________________________   PHYSICAL EXAM:  VITAL SIGNS: ED Triage Vitals  Enc Vitals Group     BP 02/20/21 2014 105/60     Pulse Rate 02/20/21 2014 90     Resp 02/20/21 2014 17     Temp 02/20/21 2014 98.5 F (36.9 C)     Temp Source 02/20/21 2014 Oral     SpO2 02/20/21 2014 100 %     Weight 02/20/21 2128 128 lb (58.1 kg)     Height 02/20/21 2128 5\' 9"  (1.753 m)     Head Circumference --      Peak Flow --      Pain Score 02/20/21 2127 5     Pain Loc --      Pain Edu? --      Excl. in GC? --     Constitutional: Alert and oriented. Well appearing and in no apparent distress. HEENT:      Head: Normocephalic and atraumatic.         Eyes: Conjunctivae are normal. Sclera is non-icteric.       Mouth/Throat: Mucous membranes are moist.       Neck: Supple with no signs of meningismus. Cardiovascular: Regular rate and rhythm. No murmurs, gallops, or rubs. 2+ symmetrical distal pulses are present in all extremities. No JVD. Respiratory: Normal respiratory effort. Lungs are clear to auscultation bilaterally.  Gastrointestinal: Soft, non tender, and non distended with positive bowel sounds. No rebound or guarding. Genitourinary: No CVA tenderness. Musculoskeletal:  No edema, cyanosis, or erythema of extremities. Neurologic: Normal speech and language. Face is symmetric.  Moving all extremities. No gross focal neurologic deficits are appreciated. Skin: Skin is warm, dry and intact. No rash noted. Psychiatric: Mood and affect are normal. Speech and behavior are normal.  ____________________________________________   LABS (all labs ordered are listed, but only abnormal results are displayed)  Labs Reviewed  HCG, QUANTITATIVE, PREGNANCY - Abnormal; Notable for the following components:      Result Value   hCG, Beta Chain, Quant, S 8,544 (*)    All other components within normal limits  CBC WITH DIFFERENTIAL/PLATELET - Abnormal;  Notable for the following components:   RBC 3.83 (*)    Hemoglobin 9.5 (*)    HCT 29.9 (*)    MCV 78.1 (*)    MCH 24.8 (*)    RDW 18.1 (*)    All other components within normal limits  COMPREHENSIVE METABOLIC PANEL - Abnormal; Notable for the following components:   Sodium 133 (*)    AST 14 (*)    All other components within normal limits  URINALYSIS, ROUTINE W REFLEX MICROSCOPIC - Abnormal; Notable for the following components:   Color, Urine YELLOW (*)    APPearance CLOUDY (*)    Specific Gravity, Urine >1.030 (*)    Hgb urine dipstick LARGE (*)    Leukocytes,Ua TRACE (*)    RBC / HPF >50 (*)    Bacteria, UA MANY (*)    All other components within normal limits  POC URINE PREG, ED - Abnormal; Notable for the following components:   Preg Test, Ur Positive (*)    All other components within normal limits  ABO/RH   ____________________________________________  EKG  none  ____________________________________________  RADIOLOGY  I have personally reviewed the images performed during this visit and I agree with the Radiologist's read.   Interpretation by Radiologist:  US OB LESS THAN 14 WEEKS WITH OB TRANSVAGINAL  Result Date: 02/20/2021 CLINICAL DATA:  Vaginal bleeding. Gestational age based on first ultrasound is 7 weeks, 2 days. EXAM: OBSTETRIC <14 WK [place] AND TRANSVAGINAL OB US TECHNIQUE: Both transabdominal and transvaginal ultrasound examinations were performed for complete evaluation of the gestation as well as the maternal uterus, adnexal regions, and pelvic cul-de-sac. Transvaginal technique was performed to assess early pregnancy. COMPARISON:  Ultrasound dated 02/11/2021. FINDINGS: Intrauterine gestational sac: Single intrauterine gestational sac. The gestational sac appears somewhat small for the fetal pole. Close follow-up recommended. Yolk sac:  Seen Embryo:  Present Cardiac Activity: Detected Heart Rate: 158 bpm CRL:  11 mm   7 w   1 d                  [place] EDC:  10/08/2021 Subchorionic hemorrhage:  None visualized. Maternal uterus/adnexae: The uterus is anteverted and appears slightly enlarged. There is heterogeneous appearance of the myometrium with findings suggestive of adenomyosis. The maternal ovaries are unremarkable. IMPRESSION: 1. Single live intrauterine pregnancy with an estimated gestational age of [redacted] weeks, 1 day. 2. 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 Electronically Signed   By: Elgie Collard M.D.   On: 02/20/2021 22:59     ____________________________________________   PROCEDURES  Procedure(s) performed: None Procedures   Critical Care performed:  None ____________________________________________   INITIAL IMPRESSION / ASSESSMENT AND PLAN / ED COURSE   30 y.o. female at [redacted] weeks gestational age presents for evaluation of vaginal bleeding.  Patient with 10 days of spotting and suprapubic cramping.  She is hemodynamically stable with stable hemoglobin at 9.5.  hCG is trending up at 8544.  Transvaginal ultrasound showing a  normal IUP with no significant abnormalities.  Patient's blood type is O+ and no indication for RhoGAM.  We discussed pelvic rest and close follow-up with OB/GYN for repeat ultrasound.  We will also discuss signs and symptoms of acute blood loss anemia recommended return to the emergency room if these develop.  I did review patient's chart including her visit to the ED 10 days ago and the results of the ultrasound then.  Patient was reassured.      _____________________________________________ Please note:  Patient was evaluated in Emergency Department today for the symptoms described in the history of present illness. Patient was evaluated in the context of the global COVID-19 pandemic, which necessitated consideration that the patient might be at risk for infection with the SARS-CoV-2 virus that causes COVID-19. Institutional protocols and algorithms that pertain to the evaluation of patients at risk for COVID-19 are in a  state of rapid change based on information released by regulatory bodies including the CDC and federal and state organizations. These policies and algorithms were followed during the patient's care in the ED.  Some ED evaluations and interventions may be delayed as a result of limited staffing during the pandemic.   [place] Controlled Substance Database was reviewed by me. ____________________________________________   FINAL CLINICAL IMPRESSION(S) / ED DIAGNOSES   Final diagnoses:  Threatened miscarriage      NEW MEDICATIONS STARTED DURING THIS VISIT:  ED Discharge Orders     None        Note:  This document was prepared using Dragon voice recognition software and may include unintentional dictation errors.    Nita Sickle, MD 02/21/21 [phone]

## 2021-02-24 ENCOUNTER — Other Ambulatory Visit: Payer: Self-pay

## 2021-02-24 ENCOUNTER — Other Ambulatory Visit: Payer: Medicaid Other

## 2021-02-24 ENCOUNTER — Other Ambulatory Visit: Payer: Self-pay | Admitting: *Deleted

## 2021-02-24 DIAGNOSIS — O209 Hemorrhage in early pregnancy, unspecified: Secondary | ICD-10-CM

## 2021-02-24 NOTE — Progress Notes (Signed)
Pt was here for HCG, looked over chart, talked with patient. Pt had heavy bleeding over the weekend and is concerned she may have miscarried. Discussed with Dr A and will order a for a formal scan.

## 2021-03-03 ENCOUNTER — Ambulatory Visit
Admission: RE | Admit: 2021-03-03 | Discharge: 2021-03-03 | Disposition: A | Discharge status: Home / Self Care | Payer: Medicaid Other | Source: Ambulatory Visit | Attending: Obstetrics & Gynecology | Admitting: Obstetrics & Gynecology

## 2021-03-03 ENCOUNTER — Other Ambulatory Visit: Payer: Self-pay

## 2021-03-03 DIAGNOSIS — O209 Hemorrhage in early pregnancy, unspecified: Secondary | ICD-10-CM | POA: Insufficient documentation

## 2021-03-03 DIAGNOSIS — Z3A08 8 weeks gestation of pregnancy: Secondary | ICD-10-CM | POA: Diagnosis not present

## 2021-03-04 ENCOUNTER — Telehealth: Payer: Self-pay | Admitting: *Deleted

## 2021-03-04 NOTE — Telephone Encounter (Signed)
Pt informed of [place] results and will keep appt on 12/28 with Dr Shawnie Pons to follow up on SAB.

## 2021-03-04 NOTE — Telephone Encounter (Signed)
-----   Message from Tereso Newcomer, MD sent at 03/04/2021 12:42 PM EST ----- Completed miscarriage. Can discuss future family planning/contraception during appointment with Dr. Shawnie Pons on 03/25/21.  Also needs pap smear. Please call to inform patient of results and recommendations.

## 2021-03-25 ENCOUNTER — Encounter: Payer: Self-pay | Admitting: Family Medicine

## 2021-03-25 ENCOUNTER — Other Ambulatory Visit (HOSPITAL_COMMUNITY)
Admission: RE | Admit: 2021-03-25 | Discharge: 2021-03-25 | Disposition: A | Discharge status: Home / Self Care | Payer: Medicaid Other | Source: Ambulatory Visit | Attending: Family Medicine | Admitting: Family Medicine

## 2021-03-25 ENCOUNTER — Ambulatory Visit (INDEPENDENT_AMBULATORY_CARE_PROVIDER_SITE_OTHER): Payer: Medicaid Other | Admitting: Family Medicine

## 2021-03-25 ENCOUNTER — Other Ambulatory Visit: Payer: Self-pay

## 2021-03-25 VITALS — BP 107/66 | HR 79 | Wt 131.0 lb

## 2021-03-25 DIAGNOSIS — Z113 Encounter for screening for infections with a predominantly sexual mode of transmission: Secondary | ICD-10-CM

## 2021-03-25 DIAGNOSIS — Z124 Encounter for screening for malignant neoplasm of cervix: Secondary | ICD-10-CM | POA: Insufficient documentation

## 2021-03-25 NOTE — Progress Notes (Signed)
° °  Subjective:    Patient ID: Hannah Wade is a 30 y.o. female presenting with No chief complaint on file.  on 03/25/2021  HPI: Has had SAB on 12/7, no further bleeding. Cycles are regular. Does not desire contraception though she also does not desire pregnancy. She would like STD screening.  Review of Systems  Constitutional:  Negative for chills and fever.  Respiratory:  Negative for shortness of breath.   Cardiovascular:  Negative for chest pain.  Gastrointestinal:  Negative for abdominal pain, nausea and vomiting.  Genitourinary:  Negative for dysuria.  Skin:  Negative for rash.     Objective:    LMP 12/25/2020 (Exact Date)  Physical Exam Constitutional:      General: She is not in acute distress.    Appearance: She is well-developed.  HENT:     Head: Normocephalic and atraumatic.  Eyes:     General: No scleral icterus. Cardiovascular:     Rate and Rhythm: Normal rate.  Pulmonary:     Effort: Pulmonary effort is normal.  Abdominal:     Palpations: Abdomen is soft.  Genitourinary:    Comments: BUS normal, vagina is pink and rugated, cervix is parous without lesion, uterus is small and anteverted, no adnexal mass or tenderness.  Musculoskeletal:     Cervical back: Neck supple.  Skin:    General: Skin is warm and dry.  Neurological:     Mental Status: She is alert and oriented to person, place, and time.        Assessment & Plan:  Screening for cervical cancer - Plan: Cytology - PAP  Screen for STD (sexually transmitted disease) - Plan: Hepatitis B surface antigen, Hepatitis C antibody, HIV Antibody (routine testing w rflx), RPR   Return in about 1 year (around 03/25/2022) for a CPE.  Reva Bores 03/25/2021 10:30 AM

## 2021-03-26 LAB — HEPATITIS C ANTIBODY: Hep C Virus Ab: 0.2 s/co ratio (ref 0.0–0.9)

## 2021-03-26 LAB — HEPATITIS B SURFACE ANTIGEN: Hepatitis B Surface Ag: NEGATIVE

## 2021-03-26 LAB — HIV ANTIBODY (ROUTINE TESTING W REFLEX): HIV Screen 4th Generation wRfx: NONREACTIVE

## 2021-03-26 LAB — RPR: RPR Ser Ql: NONREACTIVE

## 2021-03-27 LAB — CYTOLOGY - PAP
Chlamydia: NEGATIVE
Comment: NEGATIVE
Comment: NORMAL
Diagnosis: NEGATIVE
Neisseria Gonorrhea: NEGATIVE

## 2021-08-29 ENCOUNTER — Emergency Department
Admission: EM | Admit: 2021-08-29 | Discharge: 2021-08-29 | Disposition: A | Discharge status: Home / Self Care | Payer: Medicaid Other | Attending: Emergency Medicine | Admitting: Emergency Medicine

## 2021-08-29 ENCOUNTER — Other Ambulatory Visit: Payer: Self-pay

## 2021-08-29 ENCOUNTER — Encounter: Payer: Self-pay | Admitting: Emergency Medicine

## 2021-08-29 DIAGNOSIS — G43809 Other migraine, not intractable, without status migrainosus: Secondary | ICD-10-CM | POA: Insufficient documentation

## 2021-08-29 DIAGNOSIS — R519 Headache, unspecified: Secondary | ICD-10-CM | POA: Diagnosis present

## 2021-08-29 LAB — BASIC METABOLIC PANEL
Anion gap: 10 (ref 5–15)
BUN: 10 mg/dL (ref 6–20)
CO2: 20 mmol/L — ABNORMAL LOW (ref 22–32)
Calcium: 8.9 mg/dL (ref 8.9–10.3)
Chloride: 109 mmol/L (ref 98–111)
Creatinine, Ser: 0.58 mg/dL (ref 0.44–1.00)
GFR, Estimated: >60 mL/min (ref >60)
Glucose, Bld: 96 mg/dL (ref 70–99)
Potassium: 3.6 mmol/L (ref 3.5–5.1)
Sodium: 139 mmol/L (ref 135–145)

## 2021-08-29 LAB — URINALYSIS, ROUTINE W REFLEX MICROSCOPIC
Bilirubin Urine: NEGATIVE
Glucose, UA: NEGATIVE mg/dL
Hgb urine dipstick: NEGATIVE
Ketones, ur: 5 mg/dL — AB
Leukocytes,Ua: NEGATIVE
Nitrite: NEGATIVE
Protein, ur: NEGATIVE mg/dL
Specific Gravity, Urine: 1.021 (ref 1.005–1.030)
pH: 7 (ref 5.0–8.0)

## 2021-08-29 LAB — CBC
HCT: 35.5 % — ABNORMAL LOW (ref 36.0–46.0)
Hemoglobin: 11 g/dL — ABNORMAL LOW (ref 12.0–15.0)
MCH: 23.9 pg — ABNORMAL LOW (ref 26.0–34.0)
MCHC: 31 g/dL (ref 30.0–36.0)
MCV: 77.2 fL — ABNORMAL LOW (ref 80.0–100.0)
Platelets: 255 10*3/uL (ref 150–400)
RBC: 4.6 MIL/uL (ref 3.87–5.11)
RDW: 15.9 % — ABNORMAL HIGH (ref 11.5–15.5)
WBC: 7.1 10*3/uL (ref 4.0–10.5)
nRBC: 0 % (ref 0.0–0.2)

## 2021-08-29 LAB — POC URINE PREG, ED: Preg Test, Ur: NEGATIVE

## 2021-08-29 MED ORDER — ONDANSETRON 4 MG PO TBDP
4.0000 mg | ORAL_TABLET | Freq: Once | ORAL | Status: AC
  Administered 2021-08-29: 4 mg via ORAL
  Filled 2021-08-29: qty 1

## 2021-08-29 MED ORDER — IBUPROFEN 400 MG PO TABS
400.0000 mg | ORAL_TABLET | Freq: Once | ORAL | Status: AC
  Administered 2021-08-29: 400 mg via ORAL
  Filled 2021-08-29: qty 1

## 2021-08-29 MED ORDER — ONDANSETRON HCL 4 MG PO TABS: 4.0000 mg | ORAL_TABLET | Freq: Every day | ORAL | 0 refills | Status: AC | PRN

## 2021-08-29 NOTE — ED Provider Notes (Signed)
[facility] Provider Note    Event Date/Time   First MD Initiated Contact with Patient 08/29/21 1322     (approximate)   History   Headache and Vomiting   HPI  Rashelle Geschke is a 31 y.o. female   with past medical history of migraines and anemia who presents with headache and vomiting.  Patient started having headache yesterday.  Was gradual in onset and not maximal in onset.  It is located on the left side of her head.  She took some Mucinex for it.  Then today she was going to eat when she suddenly felt nauseous and had about 6 episodes of yellow emesis.  Denies diarrhea or abdominal pain.  She just finished her menstrual period no urinary symptoms.  Nausea and vomiting have resolved.  Headache is also essentially gone now.     History reviewed. No pertinent past medical history.  There are no problems to display for this patient.    Physical Exam  Triage Vital Signs: ED Triage Vitals  Enc Vitals Group     BP 08/29/21 1306 120/87     Pulse Rate 08/29/21 1306 92     Resp 08/29/21 1306 18     Temp 08/29/21 1306 97.7 F (36.5 C)     Temp Source 08/29/21 1306 Oral     SpO2 08/29/21 1306 97 %     Weight 08/29/21 1304 137 lb (62.1 kg)     Height 08/29/21 1304 5\' 9"  (1.753 m)     Head Circumference --      Peak Flow --      Pain Score 08/29/21 1304 8     Pain Loc --      Pain Edu? --      Excl. in [place]? --     Most recent vital signs: Vitals:   08/29/21 1306  BP: 120/87  Pulse: 92  Resp: 18  Temp: 97.7 F (36.5 C)  SpO2: 97%     General: Awake, no distress.  CV:  Good peripheral perfusion.  Resp:  Normal effort.  Abd:  No distention.  Soft and nontender throughout Neuro:             Awake, Alert, Oriented x 3  Other:  Aox3, nml speech  PERRL, EOMI, face symmetric, nml tongue movement  5/5 strength in the BL upper and lower extremities  Sensation grossly intact in the BL upper and lower extremities  Finger-nose-finger intact  BL    ED Results / Procedures / Treatments  Labs (all labs ordered are listed, but only abnormal results are displayed) Labs Reviewed  CBC - Abnormal; Notable for the following components:      Result Value   Hemoglobin 11.0 (*)    HCT 35.5 (*)    MCV 77.2 (*)    MCH 23.9 (*)    RDW 15.9 (*)    All other components within normal limits  BASIC METABOLIC PANEL - Abnormal; Notable for the following components:   CO2 20 (*)    All other components within normal limits  URINALYSIS, ROUTINE W REFLEX MICROSCOPIC - Abnormal; Notable for the following components:   Color, Urine YELLOW (*)    APPearance CLOUDY (*)    Ketones, ur 5 (*)    All other components within normal limits  POC URINE PREG, ED     EKG     RADIOLOGY    PROCEDURES:  Critical Care performed: No  Procedures   MEDICATIONS ORDERED  IN ED: Medications  ibuprofen (ADVIL) tablet 400 mg (400 mg Oral Given 08/29/21 1415)  ondansetron (ZOFRAN-ODT) disintegrating tablet 4 mg (4 mg Oral Given 08/29/21 1414)     IMPRESSION / MDM / ASSESSMENT AND PLAN / ED COURSE  I reviewed the triage vital signs and the nursing notes.                              Patient's presentation is most consistent with acute complicated illness / injury requiring diagnostic workup.  Differential diagnosis includes, but is not limited to, migraine headache, pregnancy, gastroenteritis, likely cerebral venous sinus thrombosis, subarachnoid hemorrhage intracranial bleed  Patient is a 31 year old female with past history of migraines who presents with headache and vomiting.  Headache started yesterday and was typical of her migraine headaches.  Is actually essentially resolved now neurologic exam is nonfocal.  Today she had about 6 episodes of vomiting back-to-back and the nausea and vomiting have now resolved.  She did have a headache at the time of the vomiting which have both improved significantly.  Does not usually vomit with her migraine  headaches.  She has no abdominal pain to suggest GI pathology we will send an hCG but patient just finished her menstrual period.  Her neurologic exam again is nonfocal I have low suspicion for serious underlying cause of headache causing both of these including cerebral venous sinus thrombosis subarachnoid intracranial hemorrhage etc.  We will treat supportively.  Patient already improving on her own.  Pregnancy test is negative.  Patient was given Zofran and ibuprofen.  Given her symptoms have improved she is tolerating p.o. she is appropriate for discharge.     FINAL CLINICAL IMPRESSION(S) / ED DIAGNOSES   Final diagnoses:  Other migraine without status migrainosus, not intractable     Rx / DC Orders   ED Discharge Orders          Ordered    ondansetron (ZOFRAN) 4 MG tablet  Daily PRN        08/29/21 1417             Note:  This document was prepared using Dragon voice recognition software and may include unintentional dictation errors.   Rada Hay, MD 08/29/21 [phone]

## 2021-08-29 NOTE — ED Notes (Signed)
Pt reports h/a x2 days. This morning after standing pt became dizzy and started vomiting.

## 2021-08-29 NOTE — ED Notes (Signed)
Urine pregnancy negative. Result has not crossed over into chart yet.

## 2021-08-29 NOTE — Discharge Instructions (Addendum)
Your pregnancy test was negative.  If you have any ongoing nausea you can take the Zofran.  You can take ibuprofen for headache.

## 2021-08-29 NOTE — ED Triage Notes (Signed)
Pt via POV from home. Pt c/o headache and vomiting. Headache started 2 days ago and vomiting started yesterday. Pt has a hx of migraine but states that it has never been this bad. States she has vomited about 6 times since this AM. Pt is A&OX4 and NAD.

## 2021-08-31 ENCOUNTER — Ambulatory Visit: Payer: Medicaid Other | Admitting: Family Medicine

## 2021-09-02 ENCOUNTER — Ambulatory Visit: Payer: Medicaid Other | Admitting: Family Medicine

## 2021-12-01 ENCOUNTER — Ambulatory Visit: Payer: Self-pay | Admitting: Internal Medicine

## 2021-12-10 ENCOUNTER — Telehealth: Payer: Self-pay

## 2021-12-10 NOTE — Telephone Encounter (Signed)
TC to pt regarding upcoming appt.  No answer LVM.

## 2021-12-23 ENCOUNTER — Ambulatory Visit: Payer: Self-pay | Admitting: Family Medicine

## 2021-12-23 ENCOUNTER — Encounter: Payer: Self-pay | Admitting: Family Medicine

## 2021-12-24 NOTE — Progress Notes (Signed)
Patient did not keep appointment today. She may call to reschedule.  

## 2022-07-09 ENCOUNTER — Ambulatory Visit: Payer: Self-pay

## 2022-08-13 IMAGING — US US OB < 14 WEEKS - US OB TV
1 series · 14 of 28 positions shown · non-contrast
Comparison: Ultrasound dated 02/11/2021.

CLINICAL DATA: Vaginal bleeding. Gestational age based on first
ultrasound is 7 weeks, 2 days.

EXAM:
OBSTETRIC <14 WK US AND TRANSVAGINAL OB US
TECHNIQUE: Both transabdominal and transvaginal ultrasound examinations were
performed for complete evaluation of the gestation as well as the
maternal uterus, adnexal regions, and pelvic cul-de-sac.
Transvaginal technique was performed to assess early pregnancy.

[Series 1: us ob less than 14 weeks with ob transvaginal · 90 acquisitions, 14 frames shown]
[im 4/90]
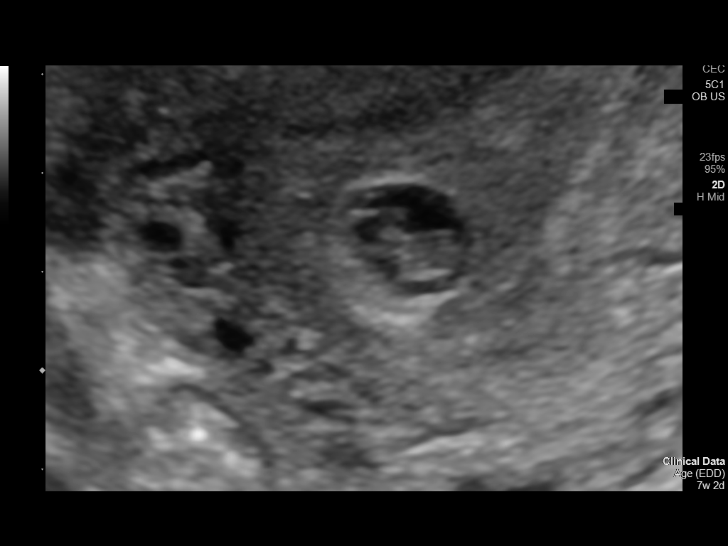
[im 10/90]
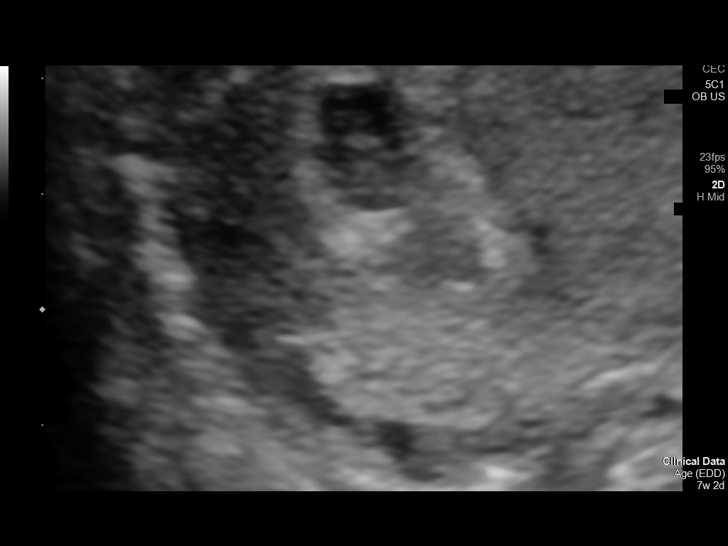
[im 17/90]
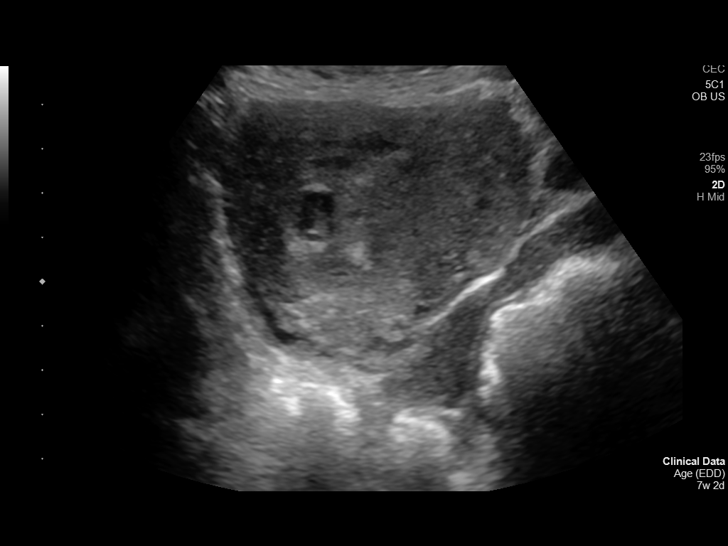
[im 24/90]
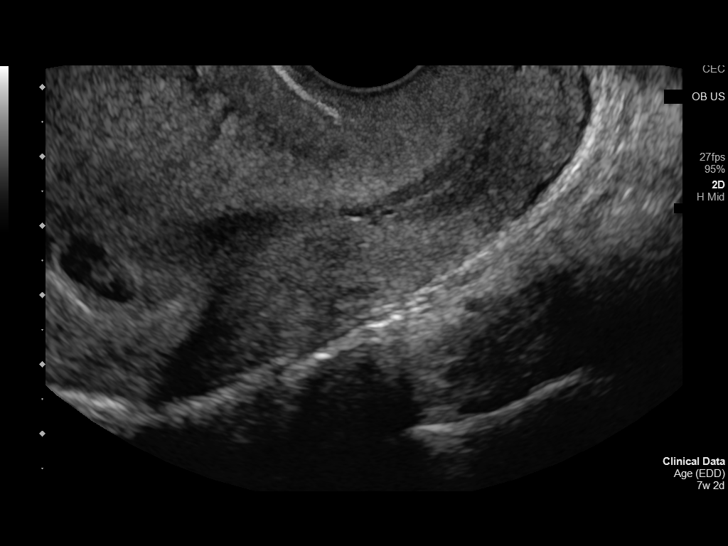
[im 30/90]
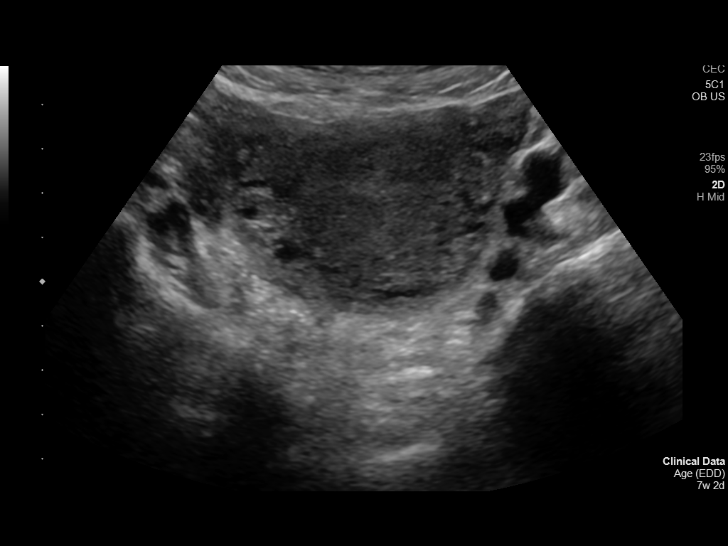
[im 37/90]
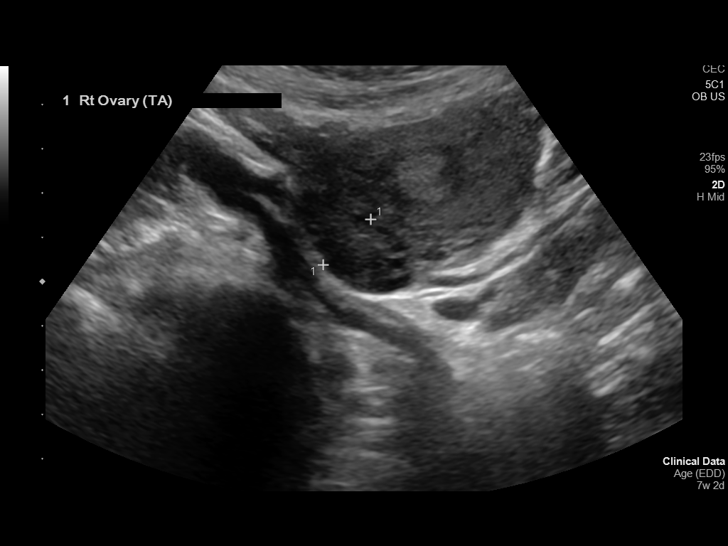
[im 43/90]
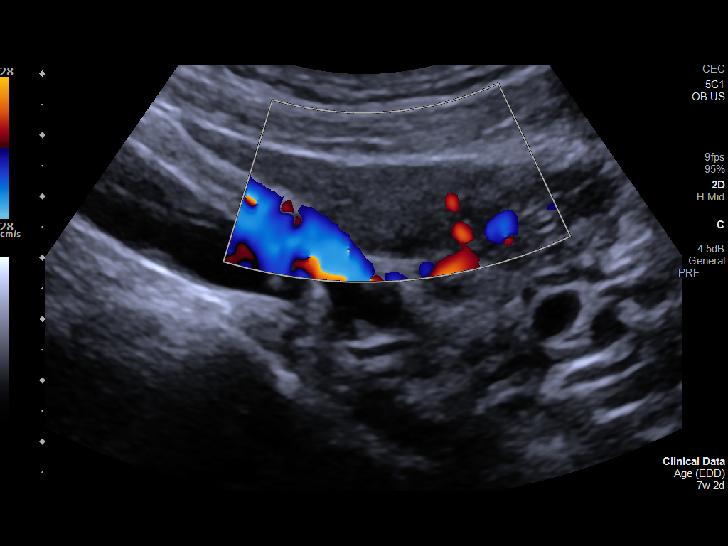
[im 50/90]
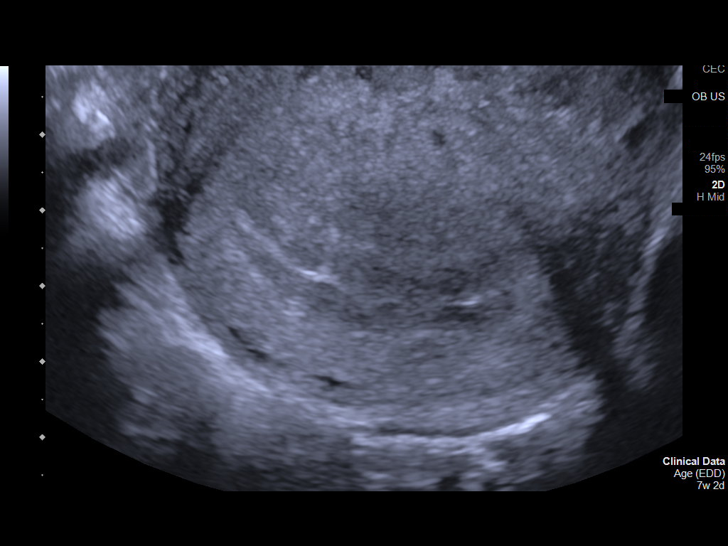
[im 57/90]
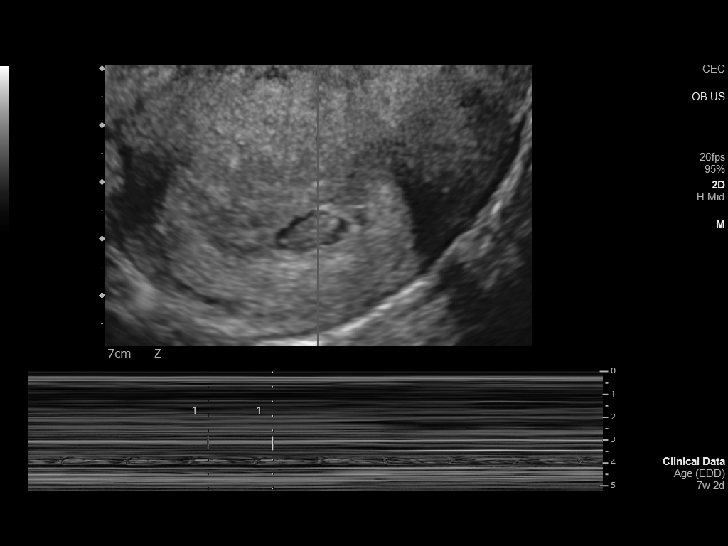
[im 63/90]
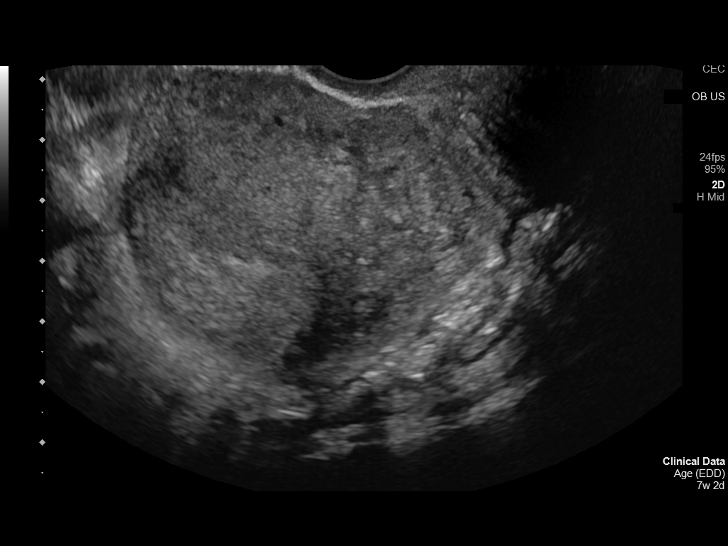
[im 70/90]
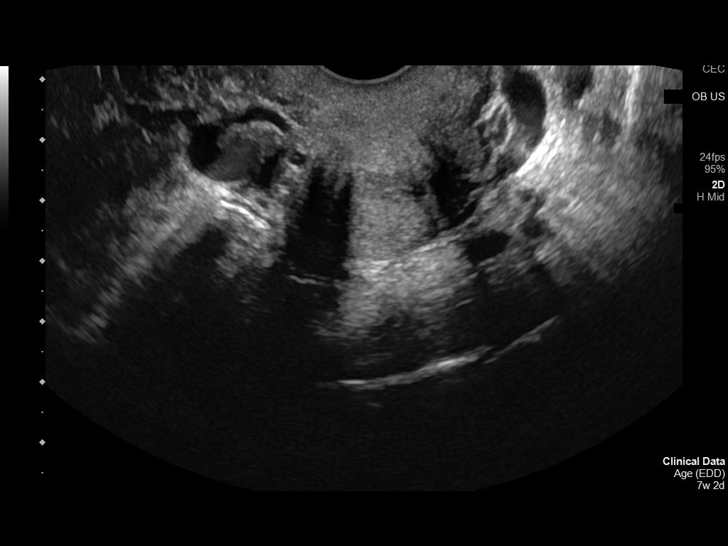
[im 76/90]
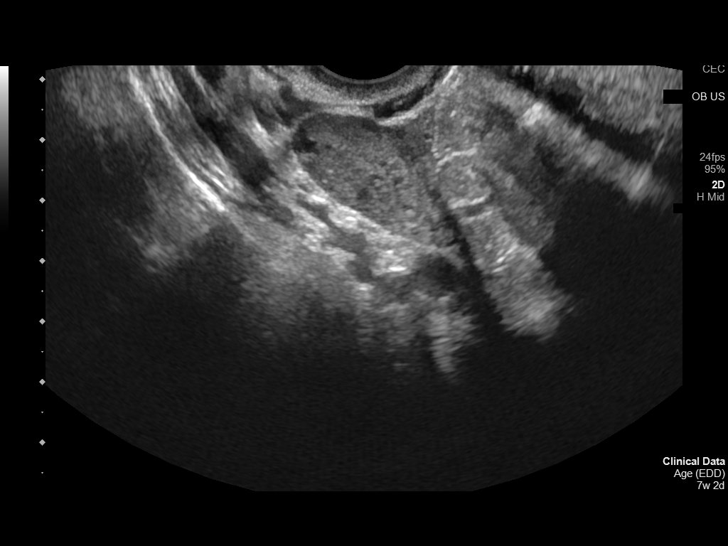
[im 83/90]
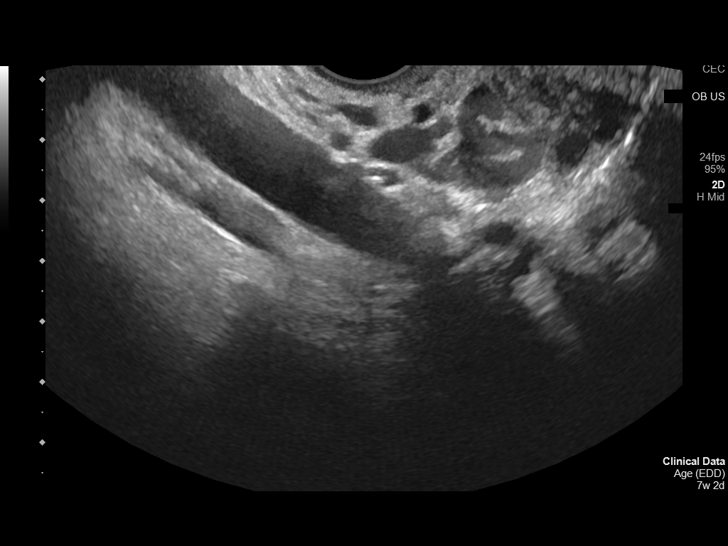
[im 90/90]
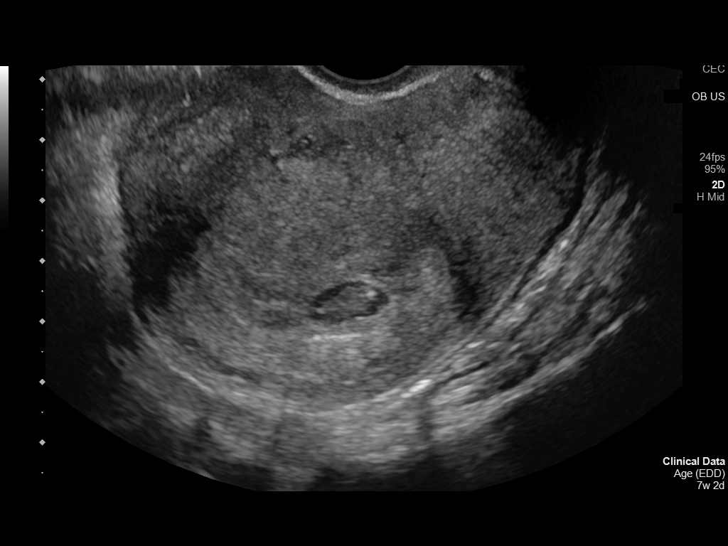

[14 of 28 positions shown; findings below may reference images not displayed]

FINDINGS: Intrauterine gestational sac: Single intrauterine gestational sac.
The gestational sac appears somewhat small for the fetal pole. Close
follow-up recommended.

Yolk sac:  Seen

Embryo:  Present

Cardiac Activity: Detected

Heart Rate: 158 bpm

CRL:  11 mm   7 w   1 d                  US EDC: 10/08/2021

Subchorionic hemorrhage:  None visualized.

Maternal uterus/adnexae: The uterus is anteverted and appears
slightly enlarged. There is heterogeneous appearance of the
myometrium with findings suggestive of adenomyosis. The maternal
ovaries are unremarkable.
IMPRESSION: 1. Single live intrauterine pregnancy with an estimated gestational
age of 7 weeks, 1 day.
2. Probable uterine adenomyosis.

## 2022-08-24 IMAGING — US US OB COMP LESS 14 WK
1 series · 15 of 28 positions shown · non-contrast
Comparison: Obstetric ultrasound 02/20/2021

CLINICAL DATA: Possible spontaneous abortion. Vaginal bleeding in
early pregnancy. Gestational age based on first ultrasound 8 weeks 4
days.

EXAM:
OBSTETRIC <14 WK US AND TRANSVAGINAL OB US
TECHNIQUE: Both transabdominal and transvaginal ultrasound examinations were
performed for complete evaluation of the gestation as well as the
maternal uterus, adnexal regions, and pelvic cul-de-sac.
Transvaginal technique was performed to assess early pregnancy.

[Series 1: us ob comp less 14 wks · 15 of 76 slices shown]
[im 1/76]
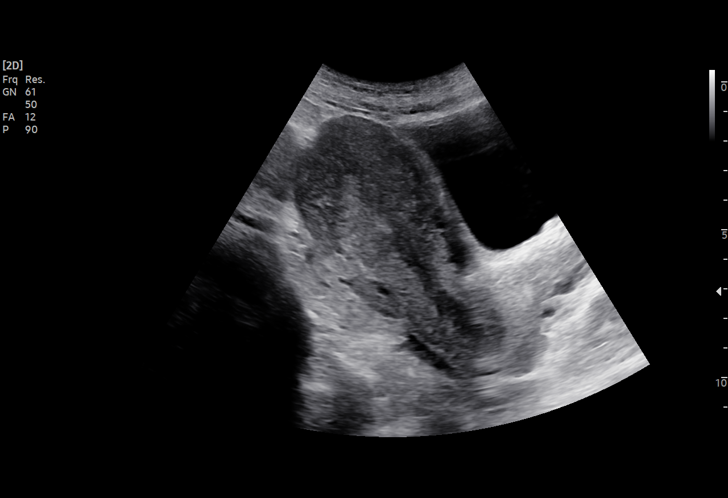
[im 6/76]
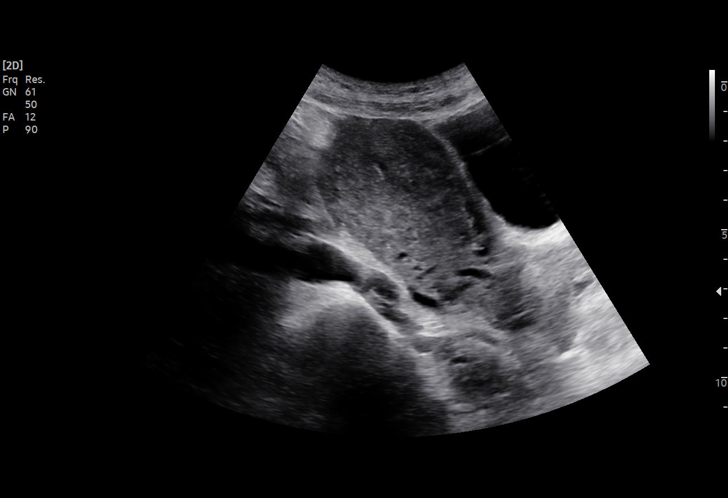
[im 12/76]
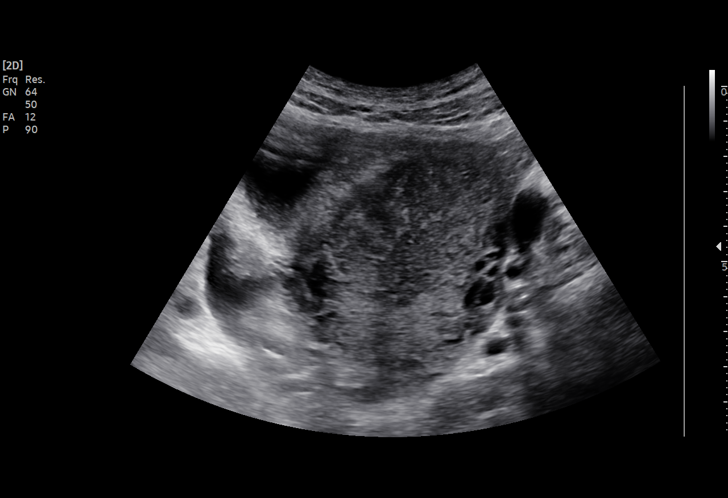
[im 17/76]
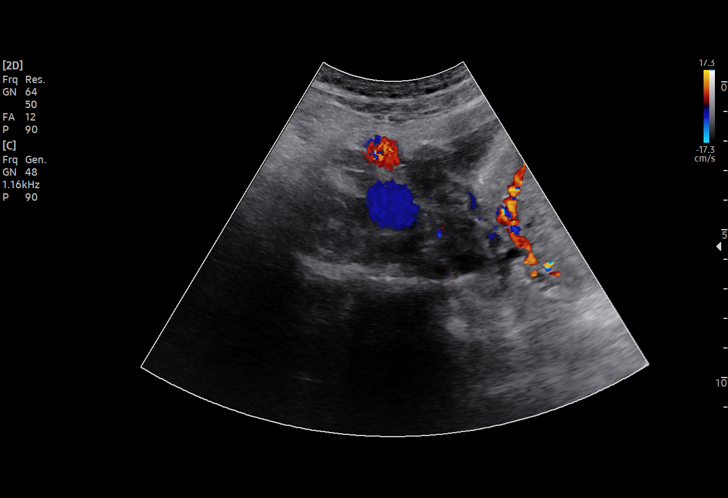
[im 23/76]
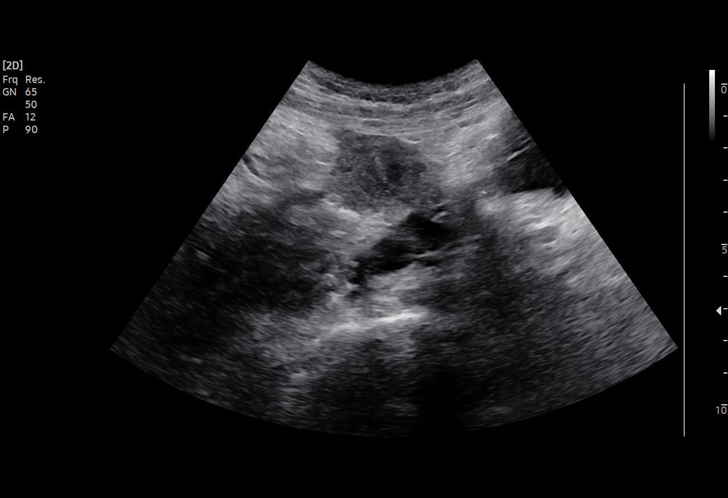
[im 28/76]
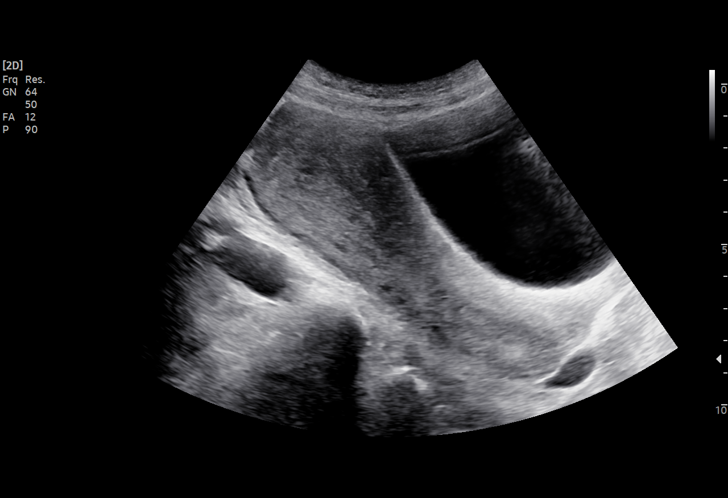
[im 34/76]
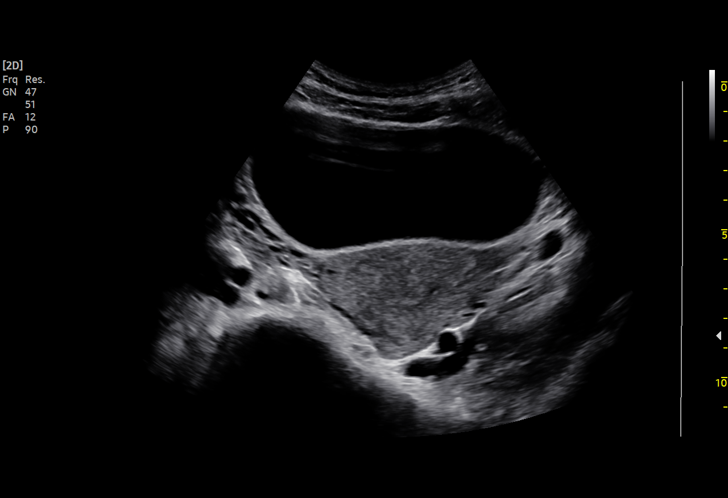
[im 39/76]
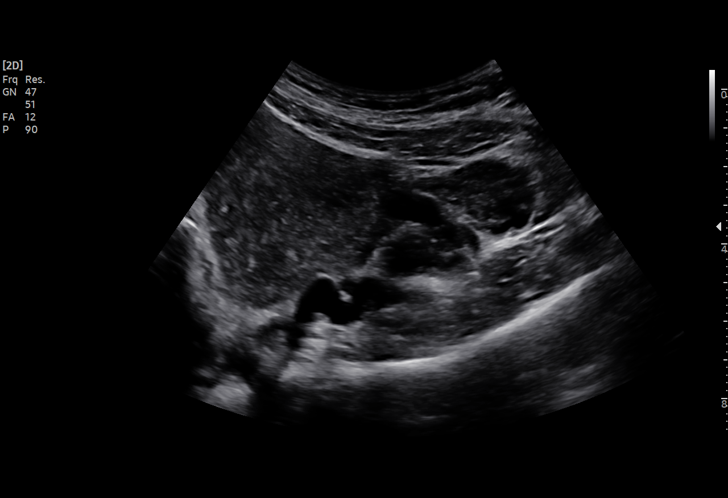
[im 42/76]
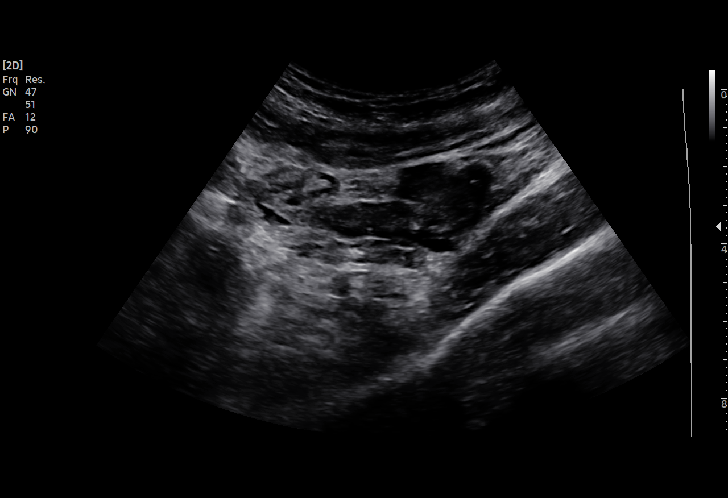
[im 48/76]
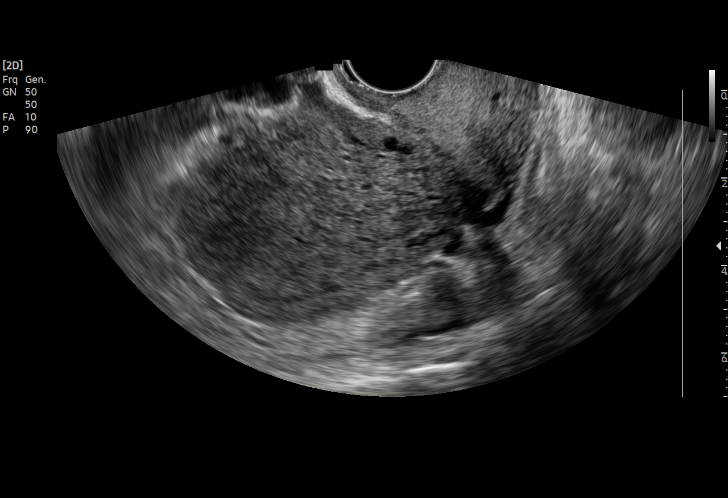
[im 53/76]
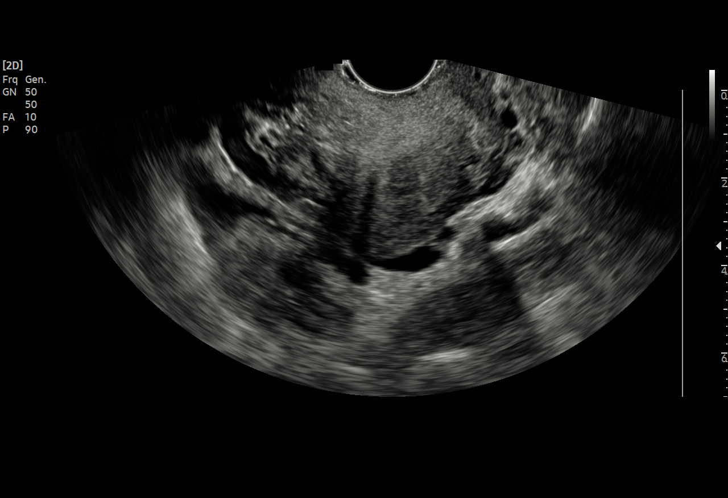
[im 59/76]
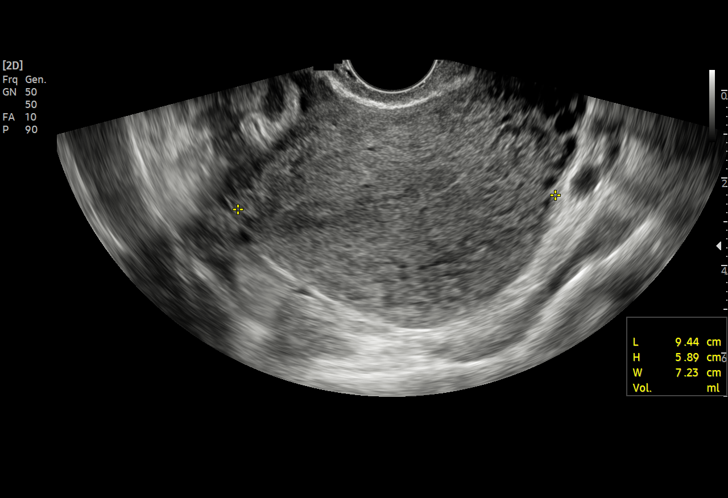
[im 64/76]
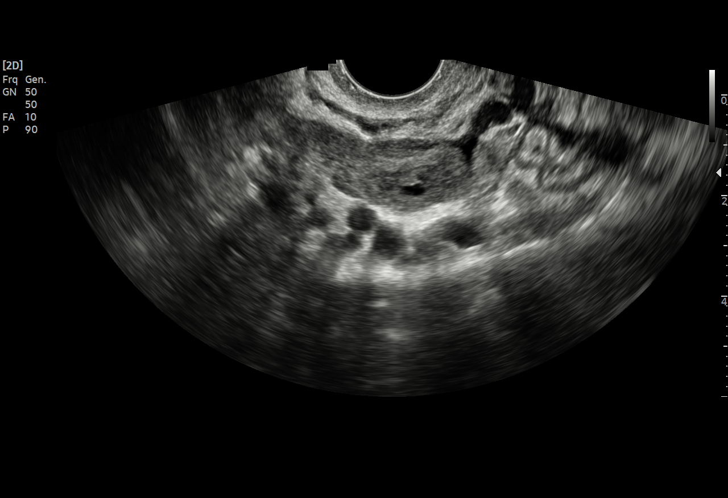
[im 70/76]
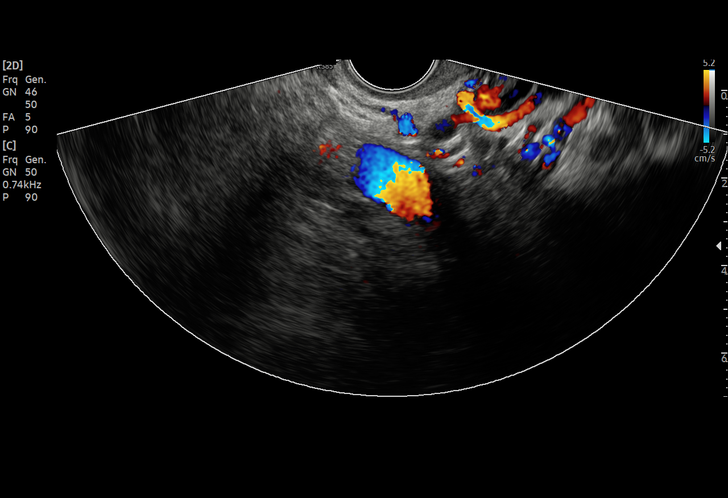
[im 76/76]
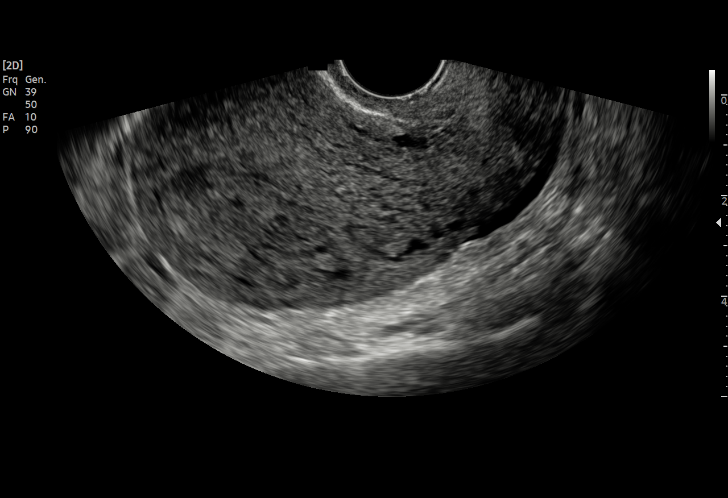

[15 of 28 positions shown; findings below may reference images not displayed]

FINDINGS: Intrauterine gestational sac: No longer visualized.

Yolk sac:  No longer visualized.

Embryo:  No longer visualized.

Maternal uterus/adnexae: Previous intrauterine gestational sac and
fetal pole are no longer seen. The uterus is anteverted.
Heterogeneous myometrium again demonstrated. The endometrium is thin
measuring 2 mm. No endometrial vascularity. Both ovaries are
visualized and are normal. There is no adnexal mass. Small volume
pelvic free fluid.
IMPRESSION: 1. Previous intrauterine pregnancy is no longer visualized
consistent with failed pregnancy.
2. Thin endometrium without vascularity, no findings to suggest
retained products of conception.
3. Heterogeneous uterine myometrium again seen, can be seen in the
setting of uterine adenomyosis.
4. No adnexal mass.  Normal sonographic appearance of the ovaries.

## 2023-11-19 ENCOUNTER — Other Ambulatory Visit: Payer: Self-pay

## 2023-11-19 ENCOUNTER — Emergency Department

## 2023-11-19 ENCOUNTER — Emergency Department
Admission: EM | Admit: 2023-11-19 | Discharge: 2023-11-19 | Disposition: A | Discharge status: Home / Self Care | Attending: Emergency Medicine | Admitting: Emergency Medicine

## 2023-11-19 DIAGNOSIS — O209 Hemorrhage in early pregnancy, unspecified: Secondary | ICD-10-CM | POA: Diagnosis not present

## 2023-11-19 DIAGNOSIS — O3481 Maternal care for other abnormalities of pelvic organs, first trimester: Secondary | ICD-10-CM | POA: Diagnosis not present

## 2023-11-19 DIAGNOSIS — N854 Malposition of uterus: Secondary | ICD-10-CM | POA: Diagnosis not present

## 2023-11-19 DIAGNOSIS — O26859 Spotting complicating pregnancy, unspecified trimester: Secondary | ICD-10-CM

## 2023-11-19 DIAGNOSIS — Z3A01 Less than 8 weeks gestation of pregnancy: Secondary | ICD-10-CM | POA: Insufficient documentation

## 2023-11-19 DIAGNOSIS — O26891 Other specified pregnancy related conditions, first trimester: Secondary | ICD-10-CM | POA: Diagnosis not present

## 2023-11-19 DIAGNOSIS — O26851 Spotting complicating pregnancy, first trimester: Secondary | ICD-10-CM | POA: Diagnosis not present

## 2023-11-19 DIAGNOSIS — Z3490 Encounter for supervision of normal pregnancy, unspecified, unspecified trimester: Secondary | ICD-10-CM

## 2023-11-19 LAB — BASIC METABOLIC PANEL WITH GFR
Anion gap: 8 (ref 5–15)
BUN: 14 mg/dL (ref 6–20)
CO2: 24 mmol/L (ref 22–32)
Calcium: 9.3 mg/dL (ref 8.9–10.3)
Chloride: 104 mmol/L (ref 98–111)
Creatinine, Ser: 0.58 mg/dL (ref 0.44–1.00)
GFR, Estimated: >60 mL/min (ref >60)
Glucose, Bld: 106 mg/dL — ABNORMAL HIGH (ref 70–99)
Potassium: 3.8 mmol/L (ref 3.5–5.1)
Sodium: 136 mmol/L (ref 135–145)

## 2023-11-19 LAB — HCG, QUANTITATIVE, PREGNANCY: hCG, Beta Chain, Quant, S: 9776 m[IU]/mL — ABNORMAL HIGH (ref <5)

## 2023-11-19 LAB — CBC WITH DIFFERENTIAL/PLATELET
Abs Immature Granulocytes: 0.02 K/uL (ref 0.00–0.07)
Basophils Absolute: 0 K/uL (ref 0.0–0.1)
Basophils Relative: 1 %
Eosinophils Absolute: 0.1 K/uL (ref 0.0–0.5)
Eosinophils Relative: 1 %
HCT: 33.5 % — ABNORMAL LOW (ref 36.0–46.0)
Hemoglobin: 10.5 g/dL — ABNORMAL LOW (ref 12.0–15.0)
Immature Granulocytes: 0 %
Lymphocytes Relative: 27 %
Lymphs Abs: 2.1 K/uL (ref 0.7–4.0)
MCH: 24.2 pg — ABNORMAL LOW (ref 26.0–34.0)
MCHC: 31.3 g/dL (ref 30.0–36.0)
MCV: 77.4 fL — ABNORMAL LOW (ref 80.0–100.0)
Monocytes Absolute: 0.4 K/uL (ref 0.1–1.0)
Monocytes Relative: 5 %
Neutro Abs: 5.2 K/uL (ref 1.7–7.7)
Neutrophils Relative %: 66 %
Platelets: 240 K/uL (ref 150–400)
RBC: 4.33 MIL/uL (ref 3.87–5.11)
RDW: 17.8 % — ABNORMAL HIGH (ref 11.5–15.5)
WBC: 8 K/uL (ref 4.0–10.5)
nRBC: 0 % (ref 0.0–0.2)

## 2023-11-19 LAB — URINALYSIS, ROUTINE W REFLEX MICROSCOPIC
Bilirubin Urine: NEGATIVE
Glucose, UA: NEGATIVE mg/dL
Hgb urine dipstick: NEGATIVE
Ketones, ur: NEGATIVE mg/dL
Leukocytes,Ua: NEGATIVE
Nitrite: NEGATIVE
Protein, ur: NEGATIVE mg/dL
Specific Gravity, Urine: 1.024 (ref 1.005–1.030)
pH: 7 (ref 5.0–8.0)

## 2023-11-19 LAB — POC URINE PREG, ED: Preg Test, Ur: POSITIVE — AB

## 2023-11-19 LAB — ABO/RH: ABO/RH(D): O POS

## 2023-11-19 MED ORDER — ONDANSETRON 4 MG PO TBDP: 4.0000 mg | ORAL_TABLET | Freq: Three times a day (TID) | ORAL | 0 refills | Status: AC | PRN

## 2023-11-19 NOTE — Discharge Instructions (Signed)
 Your exam, labs, and ultrasound are normal and reassuring. Your ultrasound does show a gestational sac within the uterus, but at this early stage, does not show a fetal pole or cardiac activity. You should follow-up with your PCP or OB provider for repeat blood work I n1 week, and repeat ultrasound in 2 weeks.

## 2023-11-19 NOTE — ED Provider Notes (Signed)
 [facility] Emergency Department Provider Note     Event Date/Time   First MD Initiated Contact with Patient 11/19/23 1722     (approximate)   History   Vaginal Bleeding   HPI  Hannah Wade is a 33 y.o. female G4P1, presents to the ED 2 weeks status post a positive home pregnancy test.  Patient reports LMP was 7/20.  She had onset of some scant pink-colored blood on the toilet tissue when she wiped this morning.  She denies any passage of large clots or any robust, frank bleeding.  She reports some cramping in the left pelvic region.  No reported nausea, vomiting, bowel changes.  No fever, chills, sweats, chest pain, or dysuria.   Physical Exam   Triage Vital Signs: ED Triage Vitals  Encounter Vitals Group     BP 11/19/23 1529 118/74     Girls Systolic BP Percentile --      Girls Diastolic BP Percentile --      Boys Systolic BP Percentile --      Boys Diastolic BP Percentile --      Pulse Rate 11/19/23 1529 96     Resp 11/19/23 1529 15     Temp 11/19/23 1529 98.5 F (36.9 C)     Temp Source 11/19/23 1529 Oral     SpO2 11/19/23 1529 100 %     Weight 11/19/23 1530 130 lb (59 kg)     Height 11/19/23 1530 5' 9 (1.753 m)     Head Circumference --      Peak Flow --      Pain Score 11/19/23 1534 5     Pain Loc --      Pain Education --      Exclude from Growth Chart --     Most recent vital signs: Vitals:   11/19/23 1529  BP: 118/74  Pulse: 96  Resp: 15  Temp: 98.5 F (36.9 C)  SpO2: 100%    General Awake, no distress. NAD HEENT NCAT. PERRL. EOMI. No rhinorrhea. Mucous membranes are moist.  CV:  Good peripheral perfusion. RRR RESP:  Normal effort.  ABD:  No distention.  GYN:  deferred   ED Results / Procedures / Treatments   Labs (all labs ordered are listed, but only abnormal results are displayed) Labs Reviewed  CBC WITH DIFFERENTIAL/PLATELET - Abnormal; Notable for the following components:      Result Value    Hemoglobin 10.5 (*)    HCT 33.5 (*)    MCV 77.4 (*)    MCH 24.2 (*)    RDW 17.8 (*)    All other components within normal limits  URINALYSIS, ROUTINE W REFLEX MICROSCOPIC - Abnormal; Notable for the following components:   Color, Urine YELLOW (*)    APPearance HAZY (*)    All other components within normal limits  BASIC METABOLIC PANEL WITH GFR - Abnormal; Notable for the following components:   Glucose, Bld 106 (*)    All other components within normal limits  HCG, QUANTITATIVE, PREGNANCY - Abnormal; Notable for the following components:   hCG, Beta Chain, Quant, S 9,776 (*)    All other components within normal limits  POC URINE PREG, ED - Abnormal; Notable for the following components:   Preg Test, Ur Positive (*)    All other components within normal limits  ABO/RH    EKG   RADIOLOGY  I personally viewed and evaluated these images as part of my medical decision  making, as well as reviewing the written report by the radiologist.  ED Provider Interpretation: Early intrauterine gestational sac without evidence of yolk sac fetal pole or cardiac activity, likely secondary to the early nature of this pregnancy.  US  OB LESS THAN 14 WEEKS WITH OB TRANSVAGINAL Result Date: 11/19/2023 CLINICAL DATA:  Pelvic pain during early pregnancy. Estimated gestational age by LMP is 4 weeks 6 days. Quantitative beta HCG is 9,776. EXAM: OBSTETRIC <14 WK US  AND TRANSVAGINAL OB US  TECHNIQUE: Both transabdominal and transvaginal ultrasound examinations were performed for complete evaluation of the gestation as well as the maternal uterus, adnexal regions, and pelvic cul-de-sac. Transvaginal technique was performed to assess early pregnancy. COMPARISON:  None Available. FINDINGS: Intrauterine gestational sac: A single intrauterine gestational sac is present. Yolk sac:  Not visualized Embryo:  Not visualized Cardiac Activity: Not visualized MSD: 7.9 mm   5 w   4 d Subchorionic hemorrhage:  None visualized.  Maternal uterus/adnexae: Uterus is anteverted. No myometrial mass lesions are identified. Both ovaries are visualized and appear normal. No abnormal adnexal masses or fluid collections. IMPRESSION: Probable early intrauterine gestational sac, but no yolk sac, fetal pole, or cardiac activity yet visualized. Recommend follow-up quantitative B-HCG levels and follow-up US  in 14 days to assess viability. This recommendation follows SRU consensus guidelines: Diagnostic Criteria for Nonviable Pregnancy Early in the First Trimester. LOISE Diedra PARAS Med 2013; 630:8556-48. Electronically Signed   By: Elsie Gravely M.D.   On: 11/19/2023 18:59    PROCEDURES:  Critical Care performed: No  Procedures   MEDICATIONS ORDERED IN ED: Medications - No data to display   IMPRESSION / MDM / ASSESSMENT AND PLAN / ED COURSE  I reviewed the triage vital signs and the nursing notes.                              Differential diagnosis includes, but is not limited to, threatened miscarriage, incomplete miscarriage, normal bleeding from an early trimester pregnancy, ectopic pregnancy, , blighted ovum, vaginal/cervical trauma, subchorionic hemorrhage/hematoma, etc.  Patient's presentation is most consistent with acute complicated illness / injury requiring diagnostic workup.  Patient's diagnosis is consistent with early IUP with a robust beta-hCG.  Remaining labs are normal and reassuring without evidence of critical anemia or leukocytosis.  No UA evidence of bacteriuria.  Pelvic ultrasound turbid by me, shows a gestational sac without evidence of a yolk sac or fetal pole at this time.  Differential includes early IUP.  Recommendation is for repeat beta quant and ultrasound in 14 days.  Patient will be discharged home with prescriptions for Zofran . Patient is to follow up with OB as needed or otherwise directed. Patient is given ED precautions to return to the ED for any worsening or new symptoms.   FINAL CLINICAL  IMPRESSION(S) / ED DIAGNOSES   Final diagnoses:  Early stage of pregnancy  Spotting in early pregnancy     Rx / DC Orders   ED Discharge Orders          Ordered    ondansetron  (ZOFRAN -ODT) 4 MG disintegrating tablet  Every 8 hours PRN        11/19/23 1916             Note:  This document was prepared using Dragon voice recognition software and may include unintentional dictation errors.    Loyd Candida LULLA Aldona, PA-C 11/19/23 1920    Dorothyann Drivers, MD 11/20/23 2255

## 2023-11-19 NOTE — ED Triage Notes (Signed)
 Pt to ED for vaginal bleeding, had (+) home pregnancy test 2 weeks ago. LMP was 7/20. Bleeding started this AM. No clots. Just seen with wiping. Color is pink. Some cramping pain to L pelvis, 5/10.

## 2023-12-08 DIAGNOSIS — Z1331 Encounter for screening for depression: Secondary | ICD-10-CM | POA: Diagnosis not present

## 2023-12-08 DIAGNOSIS — O3680X0 Pregnancy with inconclusive fetal viability, not applicable or unspecified: Secondary | ICD-10-CM | POA: Diagnosis not present

## 2023-12-14 DIAGNOSIS — D649 Anemia, unspecified: Secondary | ICD-10-CM | POA: Diagnosis not present

## 2023-12-14 DIAGNOSIS — O3680X0 Pregnancy with inconclusive fetal viability, not applicable or unspecified: Secondary | ICD-10-CM | POA: Diagnosis not present

## 2024-01-20 DIAGNOSIS — Z131 Encounter for screening for diabetes mellitus: Secondary | ICD-10-CM | POA: Diagnosis not present

## 2024-01-20 DIAGNOSIS — Z130 Encounter for screening for diseases of the blood and blood-forming organs and certain disorders involving the immune mechanism: Secondary | ICD-10-CM | POA: Diagnosis not present

## 2024-01-20 DIAGNOSIS — Z3481 Encounter for supervision of other normal pregnancy, first trimester: Secondary | ICD-10-CM | POA: Diagnosis not present

## 2024-01-20 DIAGNOSIS — O208 Other hemorrhage in early pregnancy: Secondary | ICD-10-CM | POA: Diagnosis not present

## 2024-01-20 DIAGNOSIS — Z113 Encounter for screening for infections with a predominantly sexual mode of transmission: Secondary | ICD-10-CM | POA: Diagnosis not present

## 2024-01-20 DIAGNOSIS — Z1329 Encounter for screening for other suspected endocrine disorder: Secondary | ICD-10-CM | POA: Diagnosis not present

## 2024-02-16 DIAGNOSIS — Z3482 Encounter for supervision of other normal pregnancy, second trimester: Secondary | ICD-10-CM | POA: Diagnosis not present

## 2024-03-05 DIAGNOSIS — Z3482 Encounter for supervision of other normal pregnancy, second trimester: Secondary | ICD-10-CM | POA: Diagnosis not present
